# Patient Record
Sex: Female | Born: 1935
Health system: Southern US, Community
[De-identification: ages and names within clinical notes are randomized; demographics above are authoritative.]

## PROBLEM LIST (undated history)

## (undated) DIAGNOSIS — I219 Acute myocardial infarction, unspecified: Secondary | ICD-10-CM

## (undated) DIAGNOSIS — J45909 Unspecified asthma, uncomplicated: Secondary | ICD-10-CM

## (undated) DIAGNOSIS — I10 Essential (primary) hypertension: Secondary | ICD-10-CM

## (undated) HISTORY — DX: Unspecified asthma, uncomplicated: J45.909

## (undated) HISTORY — PX: OTHER SURGICAL HISTORY: SHX169

## (undated) HISTORY — DX: Essential (primary) hypertension: I10

## (undated) HISTORY — PX: TOTAL ABDOMINAL HYSTERECTOMY W/ BILATERAL SALPINGOOPHORECTOMY: SHX83

## (undated) HISTORY — DX: Acute myocardial infarction, unspecified: I21.9

---

## 1999-02-10 ENCOUNTER — Ambulatory Visit (HOSPITAL_BASED_OUTPATIENT_CLINIC_OR_DEPARTMENT_OTHER): Admission: RE | Admit: 1999-02-10 | Discharge: 1999-02-10 | Payer: Self-pay | Admitting: Otolaryngology

## 1999-09-29 ENCOUNTER — Encounter: Admission: RE | Admit: 1999-09-29 | Discharge: 1999-09-29 | Payer: Self-pay | Admitting: Internal Medicine

## 1999-09-29 ENCOUNTER — Encounter: Payer: Self-pay | Admitting: Internal Medicine

## 1999-10-20 ENCOUNTER — Encounter: Payer: Self-pay | Admitting: Internal Medicine

## 1999-10-20 ENCOUNTER — Ambulatory Visit (HOSPITAL_COMMUNITY): Admission: RE | Admit: 1999-10-20 | Discharge: 1999-10-20 | Payer: Self-pay | Admitting: Internal Medicine

## 2003-08-25 ENCOUNTER — Ambulatory Visit (HOSPITAL_COMMUNITY): Admission: RE | Admit: 2003-08-25 | Discharge: 2003-08-25 | Payer: Self-pay | Admitting: Internal Medicine

## 2007-06-20 ENCOUNTER — Inpatient Hospital Stay (HOSPITAL_COMMUNITY): Admission: EM | Admit: 2007-06-20 | Discharge: 2007-06-24 | Payer: Self-pay | Admitting: Emergency Medicine

## 2007-06-20 ENCOUNTER — Ambulatory Visit: Payer: Self-pay | Admitting: Cardiology

## 2007-06-21 ENCOUNTER — Encounter (INDEPENDENT_AMBULATORY_CARE_PROVIDER_SITE_OTHER): Payer: Self-pay | Admitting: Internal Medicine

## 2007-07-24 ENCOUNTER — Encounter: Payer: Self-pay | Admitting: Internal Medicine

## 2007-07-24 ENCOUNTER — Encounter: Admission: RE | Admit: 2007-07-24 | Discharge: 2007-07-24 | Payer: Self-pay | Admitting: Internal Medicine

## 2008-02-10 ENCOUNTER — Encounter: Payer: Self-pay | Admitting: Internal Medicine

## 2008-03-27 ENCOUNTER — Ambulatory Visit: Payer: Self-pay | Admitting: Internal Medicine

## 2008-03-27 DIAGNOSIS — I219 Acute myocardial infarction, unspecified: Secondary | ICD-10-CM | POA: Insufficient documentation

## 2008-03-27 DIAGNOSIS — J45909 Unspecified asthma, uncomplicated: Secondary | ICD-10-CM | POA: Insufficient documentation

## 2008-03-27 DIAGNOSIS — I10 Essential (primary) hypertension: Secondary | ICD-10-CM | POA: Insufficient documentation

## 2008-04-16 ENCOUNTER — Ambulatory Visit: Payer: Self-pay | Admitting: Internal Medicine

## 2008-04-16 ENCOUNTER — Encounter: Payer: Self-pay | Admitting: Internal Medicine

## 2008-04-16 DIAGNOSIS — R0602 Shortness of breath: Secondary | ICD-10-CM | POA: Insufficient documentation

## 2008-04-28 ENCOUNTER — Ambulatory Visit: Payer: Self-pay | Admitting: Internal Medicine

## 2008-07-06 ENCOUNTER — Ambulatory Visit: Payer: Self-pay | Admitting: Internal Medicine

## 2008-07-17 DIAGNOSIS — J301 Allergic rhinitis due to pollen: Secondary | ICD-10-CM | POA: Insufficient documentation

## 2008-11-03 ENCOUNTER — Ambulatory Visit: Payer: Self-pay | Admitting: Internal Medicine

## 2009-03-11 IMAGING — CR DG CHEST 2V
2 series · 2 of 2 positions shown · non-contrast
Comparison: 07/24/2007 and 06/20/2007

CLINICAL DATA: Wheezing.  Asthma.

CHEST - 2 VIEW

[view not recorded (1 of 2)]
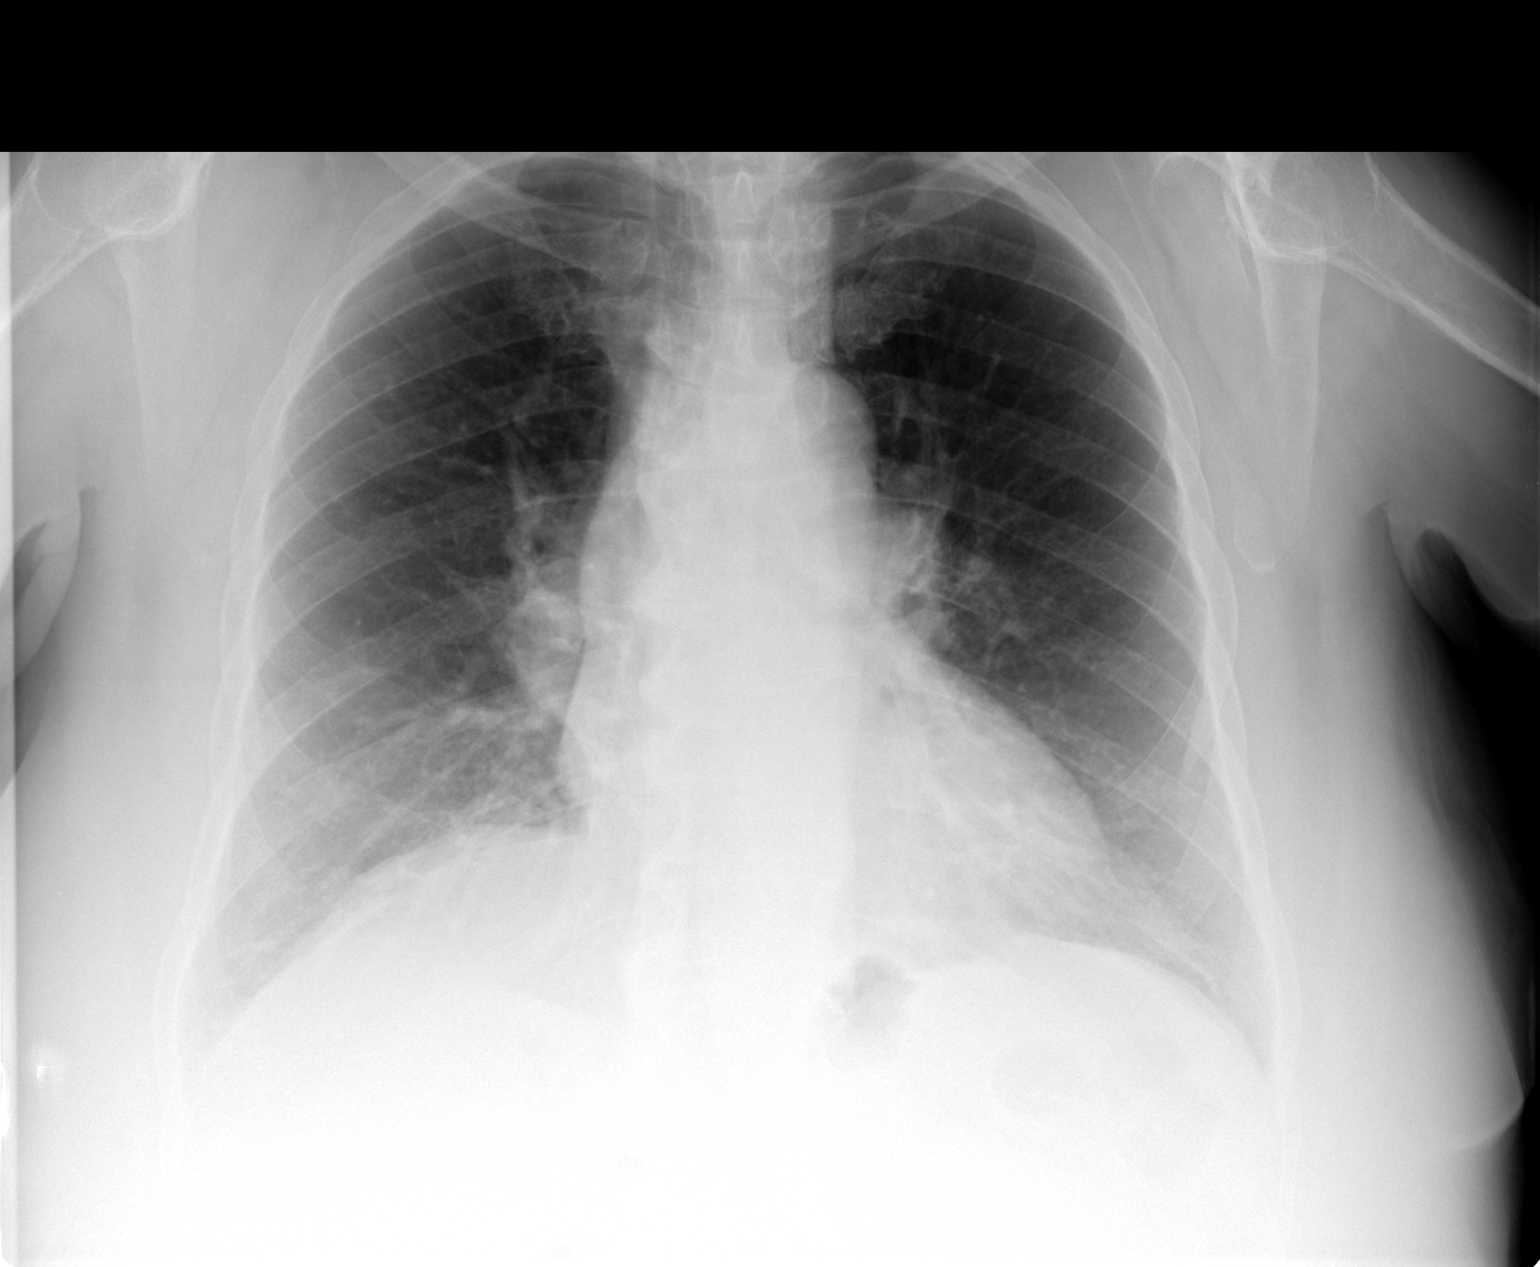

[view not recorded (2 of 2)]
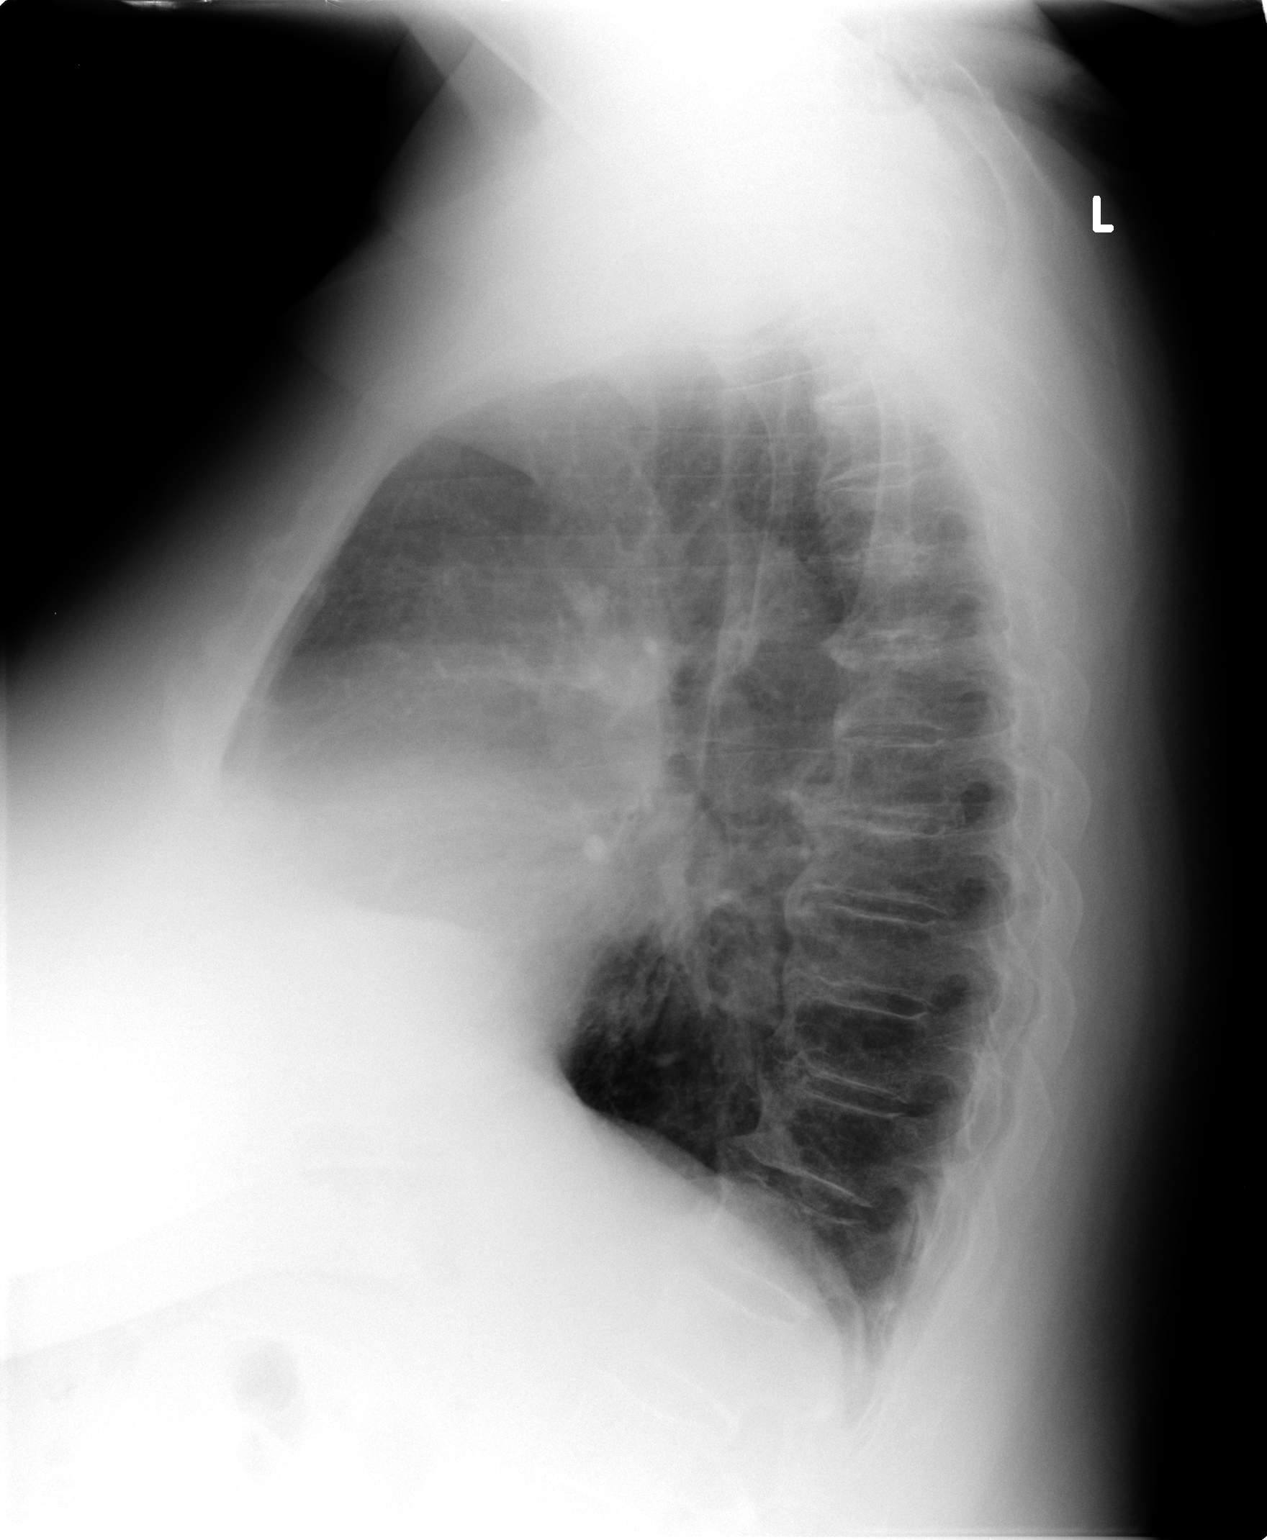

[2 of 2 positions shown; findings below may reference images not displayed]

FINDINGS: Persistent streaky opacity at the cardiac apex is
probably related to scarring.  Right lung is clear. The
cardiopericardial silhouette is within normal limits for size.
There is some fullness in the right hilum but when comparing to the
06/20/07 exam, this is unchanged. Imaged bony structures of the
thorax are intact.
IMPRESSION: Stable exam.  Probable scarring in the left lung base.  No new or
acute interval findings.

## 2009-05-13 ENCOUNTER — Telehealth: Payer: Self-pay | Admitting: Internal Medicine

## 2009-05-19 ENCOUNTER — Ambulatory Visit: Payer: Self-pay | Admitting: Internal Medicine

## 2009-05-20 ENCOUNTER — Telehealth: Payer: Self-pay | Admitting: Internal Medicine

## 2009-07-13 ENCOUNTER — Telehealth (INDEPENDENT_AMBULATORY_CARE_PROVIDER_SITE_OTHER): Payer: Self-pay | Admitting: *Deleted

## 2009-11-10 ENCOUNTER — Ambulatory Visit: Payer: Self-pay | Admitting: Internal Medicine

## 2010-05-09 ENCOUNTER — Ambulatory Visit: Payer: Self-pay | Admitting: Internal Medicine

## 2010-09-11 ENCOUNTER — Encounter: Payer: Self-pay | Admitting: Internal Medicine

## 2010-09-20 NOTE — Assessment & Plan Note (Signed)
Summary: 6 MONTH RETURN/[facility]   Copy to:  Murtis Sink Primary Provider/Referring Provider:  Dorothyann Peng  CC:  6 month follow up.  states breathing is doing fine.  denies wheezing, chest tightness, SOB, and and cough.  .  History of Present Illness: 07/06/08-Running made him cough. Says Nasonex sample stopped his cough. Using Symbicort. Denies wheeze waking him, purulent or bloody discharge, chest pain or palpitation.  11/03/08- Asthma, allergic rhinitis Left ear popped and left shoulder was sore after lifting, so she worries she may have ear infection. Got through winter without bad colds. She uses Symbicort as directed and rarely gets tight or wheezey now. Ears feel ok this morning. Using Nasonex sample now. Denies fever, chilss, headache, swollen glands, rash,  05/19/09- Asthma, allergic rhinitis...................................daughter here Got through Summer and early Fall doing well as long as she uses Symbicort once daily. last week she increased to twice daily just briefly. Has never routinely used Symbicort twice daily. We discussed price. Dr Allyne Gee office sent her somethjing, possibly Dulera, without directions.  November 10, 2009- Asthma, allergic rhinitis...................daughter here Doing fairly well without acute problems. She doesn't go outside any more than she can help.  We discussed nasal steroids. She was given sample of Omnaris and didn't realize equivalence to Nasonex. Discussion of cost effectiveness.   Current Medications (verified): 1)  Symbicort 160-4.5 Mcg/act  Aero (Budesonide-Formoterol Fumarate) .... Two Puffs Twice Daily 2)  Micardis Hct 40-12.5 Mg  Tabs (Telmisartan-Hctz) .... Take One Tablet By Mouth Daily. 3)  Nasonex 50 Mcg/act Susp (Mometasone Furoate) .Marland Kitchen.. 1-2 Sprays Each Nostril Once Daily 4)  Daily Multiple Vitamins  Tabs (Multiple Vitamin) .Marland Kitchen.. 1 Once Daily 5)  Proventil Hfa 108 (90 Base) Mcg/act Aers (Albuterol Sulfate) .... As Directed As  Needed 6)  Vitamin D (Ergocalciferol) 50000 Unit Caps (Ergocalciferol) .... Once Weekly 7)  Vitamin D 400 Unit Caps (Cholecalciferol) .... Once Daily  Allergies (verified): 1)  ! Aspirin 2)  Sulfa  Past History:  Past Medical History: Last updated: 03/27/2008 Hx of MYOCARDIAL INFARCTION (ICD-410.90) HYPERTENSION (ICD-401.9) ASTHMA (ICD-493.90) Pneumonia -2008  Family History: Last updated: 03/27/2008 Father - emphysema Maternal grandmother - heart disease Mother - altheimers  Social History: Last updated: 03/27/2008 Widowed Lives with daughter Patient never smoked. 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    Vital Signs:  Patient profile:   77 year old female Height:      61 inches Weight:      201.13 pounds BMI:     38.14 O2 Sat:      100 % on Room air Pulse rate:   88 / minute BP sitting:   122 / 68  (left arm) Cuff size:   large  Vitals Entered By: Gweneth Dimitri RN (November 10, 2009 11:15 AM)  O2 Flow:  Room air CC: 6 month follow up.  states breathing is doing fine.  denies wheezing, chest tightness, SOB, and cough.   Comments Medications reviewed with patient Daytime contact number verified with patient. Gweneth Dimitri RN  November 10, 2009 11:15 AM    Physical Exam  Additional  Exam:  General: A/Ox3; pleasant and cooperative, NAD, SKIN: no rash, lesions NODES: no lymphadenopathy HEENT: [place]/AT, EOM- WNL, Conjuctivae- clear, PERRLA, TM-WNL, Nose- clearosa looks normal, Throat- clear and wnl, Mallampati  III NECK: Supple w/  fair ROM, JVD- none, normal carotid impulses w/o bruits Thyroid-  CHEST: Clear to P&A, unlabored without cough HEART: RRR, no m/g/r heard ABDOMEN: Soft and nl;  XBJ:YNWG, nl pulses, no edema  NEURO: Grossly intact to observation      Impression & Recommendations:  Problem # 1:  ALLERGIC RHINITIS (ICD-477.9)  After discussion we will give script for fluticasone for cost check. Her updated medication list for this problem includes:    Flonase 50 Mcg/act Susp (Fluticasone propionate) .Marland Kitchen... 1-2 sprays each nostril once daily  Problem # 2:  ASTHMA (ICD-493.90) Good control.  Medications Added to Medication List This Visit: 1)  Flonase 50 Mcg/act Susp (Fluticasone propionate) .Marland Kitchen.. 1-2 sprays each nostril once daily 2)  Vitamin D (ergocalciferol) 50000 Unit Caps (Ergocalciferol) .... Once weekly 3)  Vitamin D 400 Unit Caps (Cholecalciferol) .... Once daily  Other Orders: Est. Patient Level III (95621)  Patient Instructions: 1)  Please schedule a follow-up appointment in 6 months. 2)  Script for flonase/fluticasone steroid generic nasal spray. Try this if it is cheaper, compared with Omnaris or Nasonex. For it to work, you need to use it every day. Prescriptions: FLONASE 50 MCG/ACT SUSP (FLUTICASONE PROPIONATE) 1-2 sprays each nostril once daily  #1 x prn   Entered and Authorized by:   Waymon Budge MD   Signed by:   Waymon Budge MD on 11/10/2009   Method used:   Print then Give to Patient   RxID:   [id number]

## 2010-09-20 NOTE — Assessment & Plan Note (Signed)
Summary: rov 6 months///kp   Copy to:  Murtis Sink Primary Provider/Referring Provider:  Dorothyann Peng  CC:  6 month follow up , pt states symbicort is working well only uses in am, and rarely uses proventil.  History of Present Illness: 05/19/09- Asthma, allergic rhinitis...................................daughter here Got through Summer and early Fall doing well as long as she uses Symbicort once daily. last week she increased to twice daily just briefly. Has never routinely used Symbicort twice daily. We discussed price. Dr Allyne Gee office sent her somethjing, possibly Dulera, without directions.  November 10, 2009- Asthma, allergic rhinitis...................daughter here Doing fairly well without acute problems. She doesn't go outside any more than she can help.  We discussed nasal steroids. She was given sample of Omnaris and didn't realize equivalence to Nasonex. Discussion of cost effectiveness.  May 09, 2010- Asthma, allergic rhjinitis We had done a cost comparison on steroid nasal sprays in the Spring. She made it through the summer ok. She says she hasn't wheezed since Dr Allyne Gee took her off of dairy products. She continues Symbicort, used just once daily, and hasn't need the rescue inhaler in months. She preferred Omnaris sample but it was too expensive compared with fluticasone.  Asthma History    Initial Asthma Severity Rating:    Age range: 12+ years    Symptoms: 0-2 days/week    Nighttime Awakenings: 0-2/month    Interferes w/ normal activity: no limitations    SABA use (not for EIB): 0-2 days/week    Asthma Severity Assessment: Intermittent   Preventive Screening-Counseling & Management  Alcohol-Tobacco     Smoking Status: never     Passive Smoke Exposure: yes     Passive Smoke Counseling: to avoid passive smoke exposure  Comments: Husband and coworkers did smoke in past.  Current Medications (verified): 1)  Symbicort 160-4.5 Mcg/act  Aero  (Budesonide-Formoterol Fumarate) .... Two Puffs Twice Daily 2)  Micardis Hct 40-12.5 Mg  Tabs (Telmisartan-Hctz) .... Take One Tablet By Mouth Daily. 3)  Flonase 50 Mcg/act Susp (Fluticasone Propionate) .Marland Kitchen.. 1-2 Sprays Each Nostril Once Daily 4)  Daily Multiple Vitamins  Tabs (Multiple Vitamin) .Marland Kitchen.. 1 Once Daily 5)  Proventil Hfa 108 (90 Base) Mcg/act Aers (Albuterol Sulfate) .... As Directed As Needed 6)  Vitamin D (Ergocalciferol) 50000 Unit Caps (Ergocalciferol) .... Once Weekly 7)  Vitamin D 400 Unit Caps (Cholecalciferol) .... Once Daily  Allergies (verified): 1)  ! Aspirin 2)  Sulfa  Past History:  Past Medical History: Last updated: 03/27/2008 Hx of MYOCARDIAL INFARCTION (ICD-410.90) HYPERTENSION (ICD-401.9) ASTHMA (ICD-493.90) Pneumonia -2008  Past Surgical History: Last updated: 11/10/2009 sinus surgery- polyps - 2000 T [facility] and [facility]  Family History: Last updated: 03/27/2008 Father - emphysema Maternal grandmother - heart disease Mother - altheimers  Social History: Last updated: 03/27/2008 Widowed Lives with daughter Patient never smoked. 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  The patient denies shortness of breath at rest, productive cough, non-productive cough, coughing up blood, chest pain, irregular heartbeats, acid heartburn, indigestion, loss of appetite, weight change, abdominal pain, difficulty swallowing, sore throat, tooth/dental problems, headaches, nasal congestion/difficulty breathing through nose, and sneezing.    Vital Signs:  Patient profile:   77 year old female Height:      63 inches  Weight:      203.2 pounds BMI:     36.13 O2 Sat:      96 % on Room air Pulse rate:   89 / minute BP sitting:   140 / 78  (left arm)  Vitals Entered By: Renold Genta RCP, LPN (May 09, 2010 11:12 AM)  O2 Sat at Rest %:  96% O2 Flow:  Room air CC: 6 month follow up , pt states symbicort is working well only uses in am, rarely uses proventil Comments Medications reviewed with patient Renold Genta RCP, LPN  May 09, 2010 11:15 AM    Physical Exam  Additional Exam:  General: A/Ox3; pleasant and cooperative, NAD, SKIN: no rash, lesions NODES: no lymphadenopathy HEENT: [place]/AT, EOM- WNL, Conjuctivae- clear, PERRLA, TM-WNL, Nose- clear looks normal, Throat- clear and wnl, Mallampati  III NECK: Supple w/ fair ROM, JVD- none, normal carotid impulses w/o bruits Thyroid-  CHEST: Clear to P&A, unlabored without cough, able to laugh HEART: RRR, no m/g/r heard ABDOMEN: Soft and nl; obese WGN:FAOZ, nl pulses, no edema  NEURO: Grossly intact to observation      Impression & Recommendations:  Problem # 1:  ASTHMA (ICD-493.90) She is dong very well with good control on Symbicort. I don't know how much effect avoiding  dairy products may have. We will make sure meds are available for the winter.  Flu vax discussed. She wants her daughter with her when she gets the flu shot.  Problem # 2:  ALLERGIC RHINITIS (ICD-477.9) Nasal steroid has been usually sufficient.  Her updated medication list for this problem includes:    Flonase 50 Mcg/act Susp (Fluticasone propionate) .Marland Kitchen... 1-2 sprays each nostril once daily  Other Orders: Est. Patient Level III (30865)  Patient Instructions: 1)  Please schedule a follow-up appointment in 6 months. 2)  Scripts refilling the Proventil rescue inhaler and the Symbicort maintenance inhaler were sent to your drug store.  3)  Don't forget to get a flu shot this Fall. Prescriptions: PROVENTIL HFA 108 (90 BASE) MCG/ACT AERS (ALBUTEROL SULFATE) as directed as needed  #1 x prn   Entered and Authorized by:   Waymon Budge MD   Signed by:   Waymon Budge MD on 05/09/2010   Method used:   Electronically to         [facility] [phone]* (retail)       [address], [place]  [address]       Ph: [phone] or [phone]       Fax: [phone]   RxID:   [id number] SYMBICORT 160-4.5 MCG/ACT  AERO (BUDESONIDE-FORMOTEROL FUMARATE) Two puffs twice daily  #1 x prn   Entered and Authorized by:   Waymon Budge MD   Signed by:   Waymon Budge MD on 05/09/2010   Method used:   [facility]ron[facility] to        [facility] [phone]* (retail)       [address]       [place], [place]  [address]       Ph: [phone] or [phone]       Fax: [phone]   RxID:   [id number]

## 2010-11-07 ENCOUNTER — Ambulatory Visit: Payer: Self-pay | Admitting: Internal Medicine

## 2010-12-07 ENCOUNTER — Encounter: Payer: Self-pay | Admitting: Internal Medicine

## 2010-12-07 ENCOUNTER — Telehealth: Payer: Self-pay | Admitting: Internal Medicine

## 2010-12-07 NOTE — Telephone Encounter (Signed)
Spoke w/ pt and advised her 1 sample of symbicort 160 was left upfront for pick up. Pt verbalized understanding and nothing further was needed

## 2010-12-09 ENCOUNTER — Ambulatory Visit: Payer: Self-pay | Admitting: Internal Medicine

## 2011-01-03 NOTE — Discharge Summary (Signed)
Christina Terry, Christina Terry               ACCOUNT NO.:  [id number]   MEDICAL RECORD NO.:  [id number]          PATIENT TYPE:  INP   LOCATION:  5707                         FACILITY:  [facility]   PHYSICIAN:  Altha Harm, MDDATE OF BIRTH:  1932/02/02   DATE OF ADMISSION:  07-20-07  DATE OF DISCHARGE:  06/24/2007                               DISCHARGE SUMMARY   DISCHARGE DISPOSITION:  Home.   FINAL DISCHARGE DIAGNOSES:  1. Left lower lobe pneumonia with sepsis.  2. Hypertension, resolved.  3. Pulmonary edema, resolved.  4. History of hypertension.   DISCHARGE MEDICATIONS:  1. Benicar/hydrochlorothiazide 20/12.5 mg p.o. daily.  2. Ciprofloxacin 500 mg p.o. b.i.d. x2 days.   CONSULTATIONS:  None.   PROCEDURE:  None.   DIAGNOSTIC STUDIES:  1. Chest x-ray done on 07/20/2007 which shows left lower lobe      pneumonia.  2. A 2D echocardiogram which shows normal left ventricular systolic      function with an ejection fraction of 70%, mild increase in left      ventricular wall thickness, and no regional wall motion      abnormalities.   CHIEF COMPLAINT:  Altered mental status.   HISTORY OF PRESENT ILLNESS:  Please see the H and P dictated by Dr.  Ashley Royalty for details of the HPI.   HOSPITAL COURSE:  Left lower lobe pneumonia.  The patient was admitted  and found to have a left lower lobe pneumonia.  She was also dehydrated  secondary to several days of poor p.o. intake.  The patient was given  aggressive IV hydration.  She appeared to have some degree of sepsis  with her pneumonia manifested by low blood pressures.  The patient's  blood pressures remained marginal for approximately 48 hours, and after  2 days of aggressive hydration, her blood pressures became normotensive,  and the patient did have some pulmonary edema associated with it.  The  IV fluids were stopped, Lasix was given to diurese the patient, and the  patient's blood pressures remained normal.  The patient  was started on  IV antibiotics on her day of admission to include Rocephin and  azithromycin.  She was continued on them until her blood pressure  stabilized and then switched over to oral antibiotics.  Currently, the  patient's sepsis and hypoxia have both resolved.  Her pulmonary edema is  also resolved.  The patient does continue to have a pneumonia which is  resolving.  However, is manageable by p.o. antibiotics.  The patient is  being sent home on ciprofloxacin 500 mg p.o. b.i.d. for an additional 2  1/2 days.   She is to follow up with her primary care physician, Dr. Lelon Perla, in 5  days or p.r.n. whichever comes first.   DIETARY RESTRICTIONS:  The patient should be on a heart healthy low  sodium diet.   PHYSICAL RESTRICTIONS:  None.      Altha Harm, MD  Electronically Signed     MAM/MEDQ  D:  06/24/2007  T:  06/24/2007  Job:  [id number]

## 2011-01-03 NOTE — H&P (Signed)
Christina Terry, Christina Terry               ACCOUNT NO.:  [id number]   MEDICAL RECORD NO.:  [id number]          PATIENT TYPE:  INP   LOCATION:  5707                         FACILITY:  [facility]   PHYSICIAN:  Altha Harm, MDDATE OF BIRTH:  08-May-1932   DATE OF ADMISSION:  06/20/2007  DATE OF DISCHARGE:                              HISTORY & PHYSICAL   CHIEF COMPLAINT:  General malaise.   HISTORY OF PRESENT ILLNESS:  This is a 77 year old lady who for the past  3 days states that she has not been feeling well.  The patient describes  her condition as generally feeling weak and wanting to sleep  excessively.  She denies any cough, although her daughter states that  she has heard her coughing during the night, but clearly the cough is  nonproductive.  She denies any diarrhea, she denies any chest pain, she  denies any rhinorrhea.  The patient denies any fevers, although she may  have had a tactile fever at home.   She denies any diarrhea, nausea or vomiting, she denies any chills.  She  denies any sick contacts.   PAST MEDICAL HISTORY:  Significant for:  1. Hypertension.  2. Questionable MI in the past.   FAMILY HISTORY:  Significant for hypertension.   SOCIAL HISTORY:  The patient resides with her daughter.  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   ALLERGIES:  NO KNOWN DRUG ALLERGIES.   CURRENT MEDICATIONS:  Benicar/hydrochlorothiazide 20/12.5 mg p.o. daily.   PRIMARY CARE PHYSICIAN:  Dr. Dorothyann Peng.   REVIEW OF SYSTEMS:  A comprehensive review of systems was performed to  include at least 12 systems.  All systems are negative except as noted  in the HPI.   Studies in the emergency room show the following:  A chest x-ray shows  pneumonia in the right lower lobe.  Hemogram shows a white blood cell  count of 29.2, hemoglobin of 12.2, platelet count of 254.  Sodium is  130, potassium 3.5, chloride 97, bicarb 24, BUN 23,  creatinine 1.9.   PHYSICAL EXAMINATION:  GENERAL:  The patient is laying in bed.  The  patient appears sick but not toxic appearing.  VITAL SIGNS:  Temperature upon arrival to the emergency room was 101.4.  Patient received Tylenol, temperature currently 100.8.  Blood pressure  of 112/53, heart rate 104, respiratory rate 26, O2 saturations 94% on  room air.  HEENT EXAMINATION:  She is normocephalic, atraumatic.  Pupils are  equally round and reactive to light.  Tympanic membranes are translucent  bilaterally.  Oropharynx:  Mucosa is dry, and there is no exudate,  erythema, or lesions noted.  NECK EXAMINATION:  Trachea is midline.  No masses, no thyromegaly, no  JVD, no carotid bruit.  RESPIRATORY EXAMINATION:  Patient is tachypneic; however, she has no  accessory muscle use.  She is to clear to auscultation except in the  right lower lobe.  She does have some E to A changes also noted there.  However, there is no increased vocal fremitus  noted.  CARDIOVASCULAR:  She has got mild tachycardia, normal S1 and S2.  No  murmurs, rubs, or gallops are noted.  PMI is nondisplaced.  No heaves or  thrills on palpation.  ABDOMINAL EXAMINATION:  Abdomen is obese, soft, nontender, nondistended.  No masses, no hepatosplenomegaly noted.  LYMPH NODE SURVEY:  She has got no cervical, axillary, inguinal  lymphadenopathy noted.  MUSCULOSKELETAL:  Patient has no warmth, swelling, or erythema around  the joints.  She has got no spinal tenderness.  Gait is deferred at this  time.  NEUROLOGICAL EXAMINATION:  Patient has no focal neurological deficits,  and Cranial nerves II through XII are grossly intact.  DTRs are 2+  bilaterally upper and lower extremities.  Sensory is intact to light  touch and proprioception.  PSYCHIATRIC:  She is alert and oriented x3, good insight and cognition,  and good recent and remote recall.   ASSESSMENT:  This is a patient who presents with:  1. A right lower lobe  pneumonia.  2. Leukocytosis.  3. Fever.  4. Hyponatremia.  5. Hypokalemia.  6. Mild renal insufficiency.  7. Anemia.  8. A history of hypertension.   PLAN:  The patient is going to be admitted and started on azithromycin  and ceftriaxone.  She will also receive aggressive rehydration as she is  also somewhat dehydrated.  The patient will be supported with oxygen as  needed, and further therapy will be determined based upon patient's  initial response to treatment.      Altha Harm, MD  Electronically Signed     MAM/MEDQ  D:  06/20/2007  T:  06/21/2007  Job:  [id number]   cc:   Candyce Churn. Allyne Gee, M.D.

## 2011-01-09 ENCOUNTER — Encounter: Payer: Self-pay | Admitting: Internal Medicine

## 2011-01-13 ENCOUNTER — Ambulatory Visit: Payer: Self-pay | Admitting: Internal Medicine

## 2011-01-31 ENCOUNTER — Encounter: Payer: Self-pay | Admitting: Internal Medicine

## 2011-01-31 ENCOUNTER — Ambulatory Visit (INDEPENDENT_AMBULATORY_CARE_PROVIDER_SITE_OTHER): Payer: Medicare Other | Admitting: Internal Medicine

## 2011-01-31 VITALS — BP 138/72 | HR 79 | Ht 63.0 in | Wt 196.6 lb

## 2011-01-31 DIAGNOSIS — J45909 Unspecified asthma, uncomplicated: Secondary | ICD-10-CM

## 2011-01-31 DIAGNOSIS — J309 Allergic rhinitis, unspecified: Secondary | ICD-10-CM

## 2011-01-31 MED ORDER — ALBUTEROL SULFATE HFA 108 (90 BASE) MCG/ACT IN AERS: 2.0000 | INHALATION_SPRAY | Freq: Four times a day (QID) | RESPIRATORY_TRACT | Status: DC | PRN

## 2011-01-31 NOTE — Progress Notes (Signed)
  Subjective:    Patient ID: Christina Terry, female    DOB: 06-07-1932, 77 y.o.   MRN: [id number]  HPI 01/31/11- 79 yoF followed for asthma, allergic rhinitis, complicated by hx MI, HBP. Daughter here Last here May 09, 2010 - note reviewed.  She has had just a little wheeze very occasionally with rare need for her nebulizer. She has not had any overt asthma attack and sees no reason to spend money on a rescue inhaler. Denies any new problems, heart or etc, since last here.   Review of Systems Constitutional:   No weight loss, night sweats,  Fevers, chills, fatigue, lassitude. HEENT:   No headaches,  Difficulty swallowing,  Tooth/dental problems,  Sore throat,                No sneezing, itching, ear ache, nasal congestion, post nasal drip,   CV:  No chest pain,  Orthopnea, PND, swelling in lower extremities, anasarca, dizziness, palpitations  GI  No heartburn, indigestion, abdominal pain, nausea, vomiting, diarrhea, change in bowel habits, loss of appetite  Resp: No shortness of breath with exertion or at rest.  No excess mucus, no productive cough,  No non-productive cough,  No coughing up of blood.  No change in color of mucus. Rare wheezing.    Skin: no rash or lesions.   GU: no dysuria, change in color of urine, no urgency or frequency.  No flank pain.  MS:  No joint pain or swelling.  No decreased range of motion.  No back pain.  Psych:  No change in mood or affect. No depression or anxiety.  No memory loss.       Objective:   Physical Exam General- Alert, Oriented, Affect-appropriate, Distress- none acute  overweight  Skin- rash-none, lesions- none, excoriation- none  Lymphadenopathy- none  Head- atraumatic  Eyes- Gross vision intact, PERRLA, conjunctivae clear secretions  Ears- Hearing, canals, Tm - normal  Nose- Clear, No-Septal dev, mucus, polyps, erosion, perforation   Throat- Mallampati II , mucosa clear , drainage- none, tonsils- atrophic  Neck- flexible  , trachea midline, no stridor , thyroid nl, carotid no bruit  Chest - symmetrical excursion , unlabored     Heart/CV- RRR , no murmur , no gallop  , no rub, nl s1 s2                     - JVD- none , edema- none, stasis changes- none, varices- none     Lung- clear to P&A- shallow c/w body habitus, wheeze- none, cough- none , dullness-none, rub- none     Chest wall-   Abd- tender-no, distended-no, bowel sounds-present, HSM- no  Br/ Gen/ Rectal- Not done, not indicated  Extrem- cyanosis- none, clubbing, none, atrophy- none, strength- nl  Neuro- grossly intact to observation         Assessment & Plan:

## 2011-01-31 NOTE — Assessment & Plan Note (Signed)
Relatively mild spring pollen season for her this year. Did notice eyes watering if close to her trees, but no intervention needed.

## 2011-01-31 NOTE — Assessment & Plan Note (Signed)
Good control generally. I would like her to have a rescue inhaler available and explained that.

## 2011-05-30 LAB — DIFFERENTIAL
Basophils Absolute: 0
Basophils Relative: 0
Basophils Relative: 1
Eosinophils Absolute: 0.1
Eosinophils Relative: 1
Monocytes Absolute: 1 — ABNORMAL HIGH
Monocytes Absolute: 1.2 — ABNORMAL HIGH
Monocytes Relative: 9
Neutro Abs: 6.4
Neutrophils Relative %: 71

## 2011-05-30 LAB — CBC
Hemoglobin: 10.6 — ABNORMAL LOW
Hemoglobin: 9.6 — ABNORMAL LOW
Hemoglobin: 9.6 — ABNORMAL LOW
MCHC: 34.1
MCHC: 34.6
MCV: 93.7
Platelets: 300
RBC: 2.95 — ABNORMAL LOW
RDW: 13
RDW: 13.1

## 2011-05-30 LAB — BASIC METABOLIC PANEL
BUN: 10
BUN: 17
CO2: 22
Calcium: 8.3 — ABNORMAL LOW
Calcium: 9.2
Chloride: 109
Creatinine, Ser: 0.93
GFR calc non Af Amer: 55 — ABNORMAL LOW
Glucose, Bld: 119 — ABNORMAL HIGH
Glucose, Bld: 92
Potassium: 4.3
Sodium: 137
Sodium: 139

## 2011-05-31 LAB — CBC
MCHC: 35.1
MCV: 93.5
Platelets: 254
RBC: 3.07 — ABNORMAL LOW
RBC: 3.78 — ABNORMAL LOW
RDW: 12.9
WBC: 20 — ABNORMAL HIGH

## 2011-05-31 LAB — DIFFERENTIAL
Basophils Relative: 0
Eosinophils Absolute: 0
Eosinophils Relative: 0
Lymphocytes Relative: 10 — ABNORMAL LOW
Lymphs Abs: 1.2
Lymphs Abs: 1.9
Monocytes Absolute: 1.8 — ABNORMAL HIGH
Monocytes Relative: 7
Neutrophils Relative %: 83 — ABNORMAL HIGH

## 2011-05-31 LAB — URINE CULTURE
Colony Count: NO GROWTH
Culture: NO GROWTH

## 2011-05-31 LAB — COMPREHENSIVE METABOLIC PANEL
ALT: 17
AST: 46 — ABNORMAL HIGH
Calcium: 8.8
GFR calc Af Amer: 41 — ABNORMAL LOW
Sodium: 130 — ABNORMAL LOW
Total Protein: 7.9

## 2011-05-31 LAB — CULTURE, BLOOD (ROUTINE X 2)
Culture: NO GROWTH
Culture: NO GROWTH

## 2011-05-31 LAB — URINALYSIS, ROUTINE W REFLEX MICROSCOPIC
Bilirubin Urine: NEGATIVE
Ketones, ur: NEGATIVE
Leukocytes, UA: NEGATIVE
Nitrite: NEGATIVE
Protein, ur: NEGATIVE
Urobilinogen, UA: 0.2
pH: 5.5

## 2011-05-31 LAB — BASIC METABOLIC PANEL
Chloride: 103
Creatinine, Ser: 1.58 — ABNORMAL HIGH
GFR calc Af Amer: 39 — ABNORMAL LOW
GFR calc non Af Amer: 32 — ABNORMAL LOW
Potassium: 3.1 — ABNORMAL LOW

## 2011-06-12 ENCOUNTER — Other Ambulatory Visit: Payer: Self-pay | Admitting: *Deleted

## 2011-06-12 MED ORDER — BUDESONIDE-FORMOTEROL FUMARATE 160-4.5 MCG/ACT IN AERO: 2.0000 | INHALATION_SPRAY | Freq: Two times a day (BID) | RESPIRATORY_TRACT | Status: DC

## 2011-07-31 ENCOUNTER — Telehealth: Payer: Self-pay | Admitting: Pulmonary Disease

## 2011-07-31 NOTE — Telephone Encounter (Signed)
LMTCB

## 2011-08-01 NOTE — Telephone Encounter (Signed)
Pt's daughter aware that pt should OV with [facility] on 12/12 and they can give her a flu shot at that time if she is not running a fever.

## 2011-08-02 ENCOUNTER — Encounter: Payer: Self-pay | Admitting: Internal Medicine

## 2011-08-02 ENCOUNTER — Ambulatory Visit (INDEPENDENT_AMBULATORY_CARE_PROVIDER_SITE_OTHER): Payer: Medicare Other | Admitting: Internal Medicine

## 2011-08-02 DIAGNOSIS — Z23 Encounter for immunization: Secondary | ICD-10-CM

## 2011-08-02 DIAGNOSIS — J019 Acute sinusitis, unspecified: Secondary | ICD-10-CM

## 2011-08-02 DIAGNOSIS — J301 Allergic rhinitis due to pollen: Secondary | ICD-10-CM

## 2011-08-02 DIAGNOSIS — J45909 Unspecified asthma, uncomplicated: Secondary | ICD-10-CM

## 2011-08-02 MED ORDER — PHENYLEPHRINE HCL 1 % NA SOLN
3.0000 [drp] | Freq: Once | NASAL | Status: AC
  Administered 2011-08-02: 3 [drp] via NASAL

## 2011-08-02 MED ORDER — ALBUTEROL SULFATE HFA 108 (90 BASE) MCG/ACT IN AERS: 2.0000 | INHALATION_SPRAY | Freq: Four times a day (QID) | RESPIRATORY_TRACT | Status: DC | PRN

## 2011-08-02 MED ORDER — FLUTICASONE PROPIONATE 50 MCG/ACT NA SUSP: 2.0000 | Freq: Every day | NASAL | Status: DC

## 2011-08-02 MED ORDER — BUDESONIDE-FORMOTEROL FUMARATE 160-4.5 MCG/ACT IN AERO: 2.0000 | INHALATION_SPRAY | Freq: Two times a day (BID) | RESPIRATORY_TRACT | Status: DC

## 2011-08-02 MED ORDER — METHYLPREDNISOLONE ACETATE 80 MG/ML IJ SUSP
80.0000 mg | Freq: Once | INTRAMUSCULAR | Status: AC
  Administered 2011-08-02: 80 mg via INTRAMUSCULAR

## 2011-08-02 NOTE — Progress Notes (Signed)
Patient ID: Christina Terry, female    DOB: July 06, 1932, 77 y.o.   MRN: [id number]  HPI 01/31/11- 79 yoF followed for asthma, allergic rhinitis, complicated by hx MI, HBP. Daughter here Last here May 09, 2010 - note reviewed.  She has had just a little wheeze very occasionally with rare need for her nebulizer. She has not had any overt asthma attack and sees no reason to spend money on a rescue inhaler. Denies any new problems, heart or etc, since last here.   08/02/11-  77 yoF followed for asthma, allergic rhinitis, complicated by hx MI, HBP. Daughter here Visiting [place] recently, had sinusitis with green nasal discharge but no headache, fever. Per Dr. Jovita Gamma Z-Pak. She denies fever, sore throat, muscle ache or postnasal drainage. Remains hoarse chest feels comfortable. Has run out of Symbicort, not using rescue inhaler but did use her nebulizer this morning.  Review of Systems-see HPI Constitutional:   No-   weight loss, night sweats, fevers, chills, fatigue, lassitude. HEENT:   No-  headaches, difficulty swallowing, tooth/dental problems, sore throat,       No-  sneezing, itching, ear ache, + nasal congestion, post nasal drip,  CV:  No-   chest pain, orthopnea, PND, swelling in lower extremities, anasarca,                                  dizziness, palpitations Resp: No- acute shortness of breath with exertion or at rest.              No-   productive cough,  No non-productive cough,  No- coughing up of blood.              No-   change in color of mucus.  +Mild wheezing.   Skin: No-   rash or lesions. GI:  No-   heartburn, indigestion, abdominal pain, nausea, vomiting, diarrhea,                 change in bowel habits, loss of appetite GU: No-   dysuria, change in color of urine, no urgency or frequency.  No- flank pain. MS:  No-   joint pain or swelling.  No- decreased range of motion.  No- back pain. Neuro-     nothing unusual Psych:  No- change in mood or affect. No  depression or anxiety.  No memory loss.      Objective:   Physical Exam General- Alert, Oriented, Affect-appropriate, Distress- none acute, overweight Skin- rash-none, lesions- none, excoriation- none Lymphadenopathy- none Head- atraumatic            Eyes- Gross vision intact, PERRLA, conjunctivae clear secretions            Ears- Hearing, canals-normal            Nose- Clear mucus bridging, no-Septal dev, mucus, polyps, erosion, perforation             Throat- Mallampati II , mucosa red , drainage- none, tonsils- atrophic.  Dentures. Hoarse. Neck- flexible , trachea midline, no stridor , thyroid nl, carotid no bruit Chest - symmetrical excursion , unlabored           Heart/CV- RRR , no murmur , no gallop  , no rub, nl s1 s2                           -  JVD- none , edema- none, stasis changes- none, varices- none           Lung- clear to P&A, diminished, unlabored, wheeze- none, cough- none , dullness-none, rub- none           Chest wall-  Abd- tender-no, distended-no, bowel sounds-present, HSM- no Br/ Gen/ Rectal- Not done, not indicated Extrem- cyanosis- none, clubbing, none, atrophy- none, strength- nl Neuro- grossly intact to observation

## 2011-08-02 NOTE — Patient Instructions (Signed)
Scripts refilled  Flu vax  Neb neo nasal  Depo 80

## 2011-08-06 DIAGNOSIS — J019 Acute sinusitis, unspecified: Secondary | ICD-10-CM | POA: Insufficient documentation

## 2011-08-06 NOTE — Assessment & Plan Note (Addendum)
Partially resolved acute bacterial sinusitis with residual sinus congestion and hoarseness. If we can improve drainage it should clear. Plan nasal decongestant inhalation, Depo-Medrol.

## 2011-08-06 NOTE — Assessment & Plan Note (Signed)
Plan-educated on use of rescue versus maintenance medications. Refilled Symbicort and rescue inhaler. Flu vaccine.

## 2011-08-06 NOTE — Assessment & Plan Note (Signed)
Plan-refill Nasonex. 

## 2011-10-07 ENCOUNTER — Encounter (HOSPITAL_COMMUNITY): Payer: Self-pay | Admitting: Emergency Medicine

## 2011-10-07 ENCOUNTER — Emergency Department (HOSPITAL_COMMUNITY)
Admission: EM | Admit: 2011-10-07 | Discharge: 2011-10-07 | Disposition: A | Discharge status: Home / Self Care | Payer: Medicare Other | Source: Home / Self Care | Attending: Emergency Medicine | Admitting: Emergency Medicine

## 2011-10-07 DIAGNOSIS — T50Z95A Adverse effect of other vaccines and biological substances, initial encounter: Secondary | ICD-10-CM

## 2011-10-07 DIAGNOSIS — T8062XA Other serum reaction due to vaccination, initial encounter: Secondary | ICD-10-CM

## 2011-10-07 MED ORDER — TRIAMCINOLONE ACETONIDE 0.1 % EX CREA: TOPICAL_CREAM | Freq: Two times a day (BID) | CUTANEOUS | Status: AC

## 2011-10-07 MED ORDER — TRIAMCINOLONE ACETONIDE 0.1 % EX CREA: TOPICAL_CREAM | Freq: Two times a day (BID) | CUTANEOUS | Status: DC

## 2011-10-07 MED ORDER — PREDNISONE 20 MG PO TABS: 40.0000 mg | ORAL_TABLET | Freq: Every day | ORAL | Status: AC

## 2011-10-07 NOTE — ED Provider Notes (Signed)
History     CSN: [id number]  Arrival date & time 10/07/11  1139   First MD Initiated Contact with Patient 10/07/11 1245      Chief Complaint  Patient presents with  . Arm Pain    (Consider location/radiation/quality/duration/timing/severity/associated sxs/prior treatment) HPI Comments: Patient presents urgent care with a left upper arm pain and redness. Patient reports about a week ago she received a tetanus shot since she was overdue for it. Have been sore since the shot was given but has increased in intensity and now has a area of redness around the injection site then it's looking larger. It's tender at touch and with arm movement. No fevers and  besides recent injection denies any further traumas or injuries. Otherwise patient feels fine no chills or fevers or any other symptoms.   Patient is a 78 y.o. female presenting with arm pain. The history is provided by the patient.  Arm Pain This is a new problem. The current episode started more than 1 week ago. The problem occurs constantly. The problem has not changed since onset.The symptoms are relieved by ice. She has tried acetaminophen for the symptoms.    Past Medical History  Diagnosis Date  . Acute myocardial infarction, unspecified site, episode of care unspecified   . Unspecified asthma   . Unspecified essential hypertension     Past Surgical History  Procedure Date  . Total abdominal hysterectomy w/ bilateral salpingoophorectomy   . Sinus surgey     Family History  Problem Relation Age of Onset  . Emphysema Father   . Heart disease Maternal Grandmother     History  Substance Use Topics  . Smoking status: Never Smoker   . Smokeless tobacco: Not on file  . Alcohol Use: No    OB History    Grav Para Term Preterm Abortions TAB SAB Ect Mult Living                  Review of Systems  Constitutional: Negative for fever.  Skin: Positive for rash. Negative for wound.    Allergies  Aspirin and Sulfonamide  derivatives  Home Medications   Current Outpatient Rx  Name Route Sig Dispense Refill  . ALBUTEROL SULFATE HFA 108 (90 BASE) MCG/ACT IN AERS Inhalation Inhale 2 puffs into the lungs every 6 (six) hours as needed for wheezing or shortness of breath (Rescue inahaler). 1 Inhaler prn  . BUDESONIDE-FORMOTEROL FUMARATE 160-4.5 MCG/ACT IN AERO Inhalation Inhale 2 puffs into the lungs 2 (two) times daily. Rinse mouth 1 Inhaler prn  . ERGOCALCIFEROL 50000 UNITS PO CAPS Oral Take 50,000 Units by mouth once a week.      Marland Kitchen FLUTICASONE PROPIONATE 50 MCG/ACT NA SUSP Nasal Place 2 sprays into the nose daily. 1 g prn  . MULTIVITAMINS PO CAPS Oral Take 1 capsule by mouth daily.      Marland Kitchen PREDNISONE 20 MG PO TABS Oral Take 2 tablets (40 mg total) by mouth daily. 15 tablet 0  . TELMISARTAN-HCTZ 40-12.5 MG PO TABS Oral Take 1 tablet by mouth daily.      . TRIAMCINOLONE ACETONIDE 0.1 % EX CREA Topical Apply topically 2 (two) times daily. X 7 days 30 g 0    BP 133/74  Pulse 80  Temp(Src) 97.6 F (36.4 C) (Oral)  Resp 18  SpO2 95%  Physical Exam  Constitutional: She appears well-developed and well-nourished. No distress.  HENT:  Head: Normocephalic.  Eyes: Conjunctivae are normal.  Neck: Neck supple.  Musculoskeletal:  She exhibits tenderness.       Left shoulder: She exhibits tenderness, swelling and pain. She exhibits normal range of motion and no bony tenderness.       Arms: Skin: Rash noted. There is erythema.    ED Course  Procedures (including critical care time)  Labs Reviewed - No data to display No results found.   1. Vaccine reaction       MDM  Localized reaction at injection site. Erythema is noted in the lateral aspect of deltoid muscle erythema with areas of clear skin motor and exam. Exam is more consistent with a local allergenic reaction reaction than two  a localized cellulitic or infectious process.        Jimmie Molly, MD 10/07/11 1730

## 2011-10-07 NOTE — Discharge Instructions (Signed)
Your exam was consistent with a localized reaction which is a common side effect of this particular vaccine. Have suggested you monitor her for any changes closely looking for signs of infection as discussed during your exam. In the meantime would suggest you take this medicine and also apply this prescribed a cream for 5 days.

## 2011-10-07 NOTE — ED Notes (Signed)
Left deltoid with bruise and redness, patient reports receiving tetanus shot one week ago at this site.  Reports this arm very tender to touch.

## 2012-01-31 ENCOUNTER — Ambulatory Visit: Payer: Medicare Other | Admitting: Internal Medicine

## 2012-05-29 ENCOUNTER — Other Ambulatory Visit: Payer: Self-pay | Admitting: Internal Medicine

## 2012-05-29 DIAGNOSIS — Z139 Encounter for screening, unspecified: Secondary | ICD-10-CM

## 2012-06-24 ENCOUNTER — Other Ambulatory Visit: Payer: Medicare Other

## 2015-08-01 ENCOUNTER — Encounter (HOSPITAL_COMMUNITY): Payer: Self-pay

## 2015-08-01 ENCOUNTER — Emergency Department (INDEPENDENT_AMBULATORY_CARE_PROVIDER_SITE_OTHER)
Admission: EM | Admit: 2015-08-01 | Discharge: 2015-08-01 | Disposition: A | Discharge status: Home / Self Care | Payer: Commercial Managed Care - HMO | Source: Home / Self Care | Attending: Family Medicine | Admitting: Family Medicine

## 2015-08-01 DIAGNOSIS — R05 Cough: Secondary | ICD-10-CM

## 2015-08-01 DIAGNOSIS — R059 Cough, unspecified: Secondary | ICD-10-CM

## 2015-08-01 DIAGNOSIS — J45901 Unspecified asthma with (acute) exacerbation: Secondary | ICD-10-CM | POA: Diagnosis not present

## 2015-08-01 MED ORDER — PREDNISONE 20 MG PO TABS
20.0000 mg | ORAL_TABLET | Freq: Every day | ORAL | Status: DC
Start: 2015-08-01 — End: 2019-02-20

## 2015-08-01 MED ORDER — IPRATROPIUM-ALBUTEROL 0.5-2.5 (3) MG/3ML IN SOLN
3.0000 mL | Freq: Once | RESPIRATORY_TRACT | Status: AC
  Administered 2015-08-01: 3 mL via RESPIRATORY_TRACT

## 2015-08-01 MED ORDER — IPRATROPIUM-ALBUTEROL 0.5-2.5 (3) MG/3ML IN SOLN
RESPIRATORY_TRACT | Status: AC
  Filled 2015-08-01: qty 3

## 2015-08-01 MED ORDER — BENZONATATE 100 MG PO CAPS: 100.0000 mg | ORAL_CAPSULE | Freq: Three times a day (TID) | ORAL | Status: DC

## 2015-08-01 NOTE — Discharge Instructions (Signed)
It was nice seeing you. It seems you have slight exacerbation of your asthma secondary to upper respiratory infection. You may continue the antibiotic given previously. Also use prednisone for 10 days and tessalon as needed for cough. If no improvement please return to see us soon.  Asthma, Adult Asthma is a recurring condition in which the airways tighten and narrow. Asthma can make it difficult to breathe. It can cause coughing, wheezing, and shortness of breath. Asthma episodes, also called asthma attacks, range from minor to life-threatening. Asthma cannot be cured, but medicines and lifestyle changes can help control it. CAUSES Asthma is believed to be caused by inherited (genetic) and environmental factors, but its exact cause is unknown. Asthma may be triggered by allergens, lung infections, or irritants in the air. Asthma triggers are different for each person. Common triggers include:   Animal dander.  Dust mites.  Cockroaches.  Pollen from trees or grass.  Mold.  Smoke.  Air pollutants such as dust, household cleaners, hair sprays, aerosol sprays, paint fumes, strong chemicals, or strong odors.  Cold air, weather changes, and winds (which increase molds and pollens in the air).  Strong emotional expressions such as crying or laughing hard.  Stress.  Certain medicines (such as aspirin) or types of drugs (such as beta-blockers).  Sulfites in foods and drinks. Foods and drinks that may contain sulfites include dried fruit, potato chips, and sparkling grape juice.  Infections or inflammatory conditions such as the flu, a cold, or an inflammation of the nasal membranes (rhinitis).  Gastroesophageal reflux disease (GERD).  Exercise or strenuous activity. SYMPTOMS Symptoms may occur immediately after asthma is triggered or many hours later. Symptoms include:  Wheezing.  Excessive nighttime or early morning coughing.  Frequent or severe coughing with a common  cold.  Chest tightness.  Shortness of breath. DIAGNOSIS  The diagnosis of asthma is made by a review of your medical history and a physical exam. Tests may also be performed. These may include:  Lung function studies. These tests show how much air you breathe in and out.  Allergy tests.  Imaging tests such as X-rays. TREATMENT  Asthma cannot be cured, but it can usually be controlled. Treatment involves identifying and avoiding your asthma triggers. It also involves medicines. There are 2 classes of medicine used for asthma treatment:   Controller medicines. These prevent asthma symptoms from occurring. They are usually taken every day.  Reliever or rescue medicines. These quickly relieve asthma symptoms. They are used as needed and provide short-term relief. Your health care provider will help you create an asthma action plan. An asthma action plan is a written plan for managing and treating your asthma attacks. It includes a list of your asthma triggers and how they may be avoided. It also includes information on when medicines should be taken and when their dosage should be changed. An action plan may also involve the use of a device called a peak flow meter. A peak flow meter measures how well the lungs are working. It helps you monitor your condition. HOME CARE INSTRUCTIONS   Take medicines only as directed by your health care provider. Speak with your health care provider if you have questions about how or when to take the medicines.  Use a peak flow meter as directed by your health care provider. Record and keep track of readings.  Understand and use the action plan to help minimize or stop an asthma attack without needing to seek medical care.  Control  your home environment in the following ways to help prevent asthma attacks:  Do not smoke. Avoid being exposed to secondhand smoke.  Change your heating and air conditioning filter regularly.  Limit your use of fireplaces and  wood stoves.  Get rid of pests (such as roaches and mice) and their droppings.  Throw away plants if you see mold on them.  Clean your floors and dust regularly. Use unscented cleaning products.  Try to have someone else vacuum for you regularly. Stay out of rooms while they are being vacuumed and for a short while afterward. If you vacuum, use a dust mask from a hardware store, a double-layered or microfilter vacuum cleaner bag, or a vacuum cleaner with a HEPA filter.  Replace carpet with wood, tile, or vinyl flooring. Carpet can trap dander and dust.  Use allergy-proof pillows, mattress covers, and box spring covers.  Wash bed sheets and blankets every week in hot water and dry them in a dryer.  Use blankets that are made of polyester or cotton.  Clean bathrooms and kitchens with bleach. If possible, have someone repaint the walls in these rooms with mold-resistant paint. Keep out of the rooms that are being cleaned and painted.  Wash hands frequently. SEEK MEDICAL CARE IF:   You have wheezing, shortness of breath, or a cough even if taking medicine to prevent attacks.  The colored mucus you cough up (sputum) is thicker than usual.  Your sputum changes from clear or white to yellow, green, gray, or bloody.  You have any problems that may be related to the medicines you are taking (such as a rash, itching, swelling, or trouble breathing).  You are using a reliever medicine more than 2-3 times per week.  Your peak flow is still at 50-79% of your personal best after following your action plan for 1 hour.  You have a fever. SEEK IMMEDIATE MEDICAL CARE IF:   You seem to be getting worse and are unresponsive to treatment during an asthma attack.  You are short of breath even at rest.  You get short of breath when doing very little physical activity.  You have difficulty eating, drinking, or talking due to asthma symptoms.  You develop chest pain.  You develop a fast  heartbeat.  You have a bluish color to your lips or fingernails.  You are light-headed, dizzy, or faint.  Your peak flow is less than 50% of your personal best.   This information is not intended to replace advice given to you by your health care provider. Make sure you discuss any questions you have with your health care provider.   Document Released: 08/07/2005 Document Revised: 04/28/2015 Document Reviewed: 03/06/2013 Elsevier Interactive Patient Education [employer]

## 2015-08-01 NOTE — ED Notes (Signed)
Patient states she had a coughing fit this am and was having some SOB Patient does not use a nebulizer at home  Only  Carries a rescue inhaler

## 2015-08-01 NOTE — ED Provider Notes (Signed)
CSN: [id number]     Arrival date & time 08/01/15  1322 History   First MD Initiated Contact with Patient 08/01/15 1422     Chief Complaint  Patient presents with  . Asthma   (Consider location/radiation/quality/duration/timing/severity/associated sxs/prior Treatment) Patient is a 81 y.o. female presenting with cough. The history is provided by the patient. No language interpreter was used.  Cough Cough characteristics:  Productive (but improving) Sputum characteristics:  Green Severity:  Moderate Onset quality:  Gradual Duration:  4 days Timing:  Constant Progression:  Waxing and waning Chronicity:  Recurrent (She has hx of asthma. She has not had issues with her asthma till now) Context: weather changes   Context: not animal exposure, not occupational exposure, not sick contacts and not smoke exposure   Relieved by:  Beta-agonist inhaler and steroid inhaler (Nothing helps, she was started on Z-pak yesterday with minimal improvement) Worsened by:  Activity and exposure to cold air Associated symptoms: shortness of breath and wheezing   Associated symptoms: no chest pain and no fever     Past Medical History  Diagnosis Date  . Acute myocardial infarction, unspecified site, episode of care unspecified   . Unspecified asthma(493.90)   . Unspecified essential hypertension    Past Surgical History  Procedure Laterality Date  . Total abdominal hysterectomy w/ bilateral salpingoophorectomy    . Sinus surgey     Family History  Problem Relation Age of Onset  . Emphysema Father   . Heart disease Maternal Grandmother    Social History  Substance Use Topics  . Smoking status: Never Smoker   . Smokeless tobacco: None  . Alcohol Use: No   OB History    No data available     Review of Systems  Constitutional: Negative for fever.  Respiratory: Positive for cough, shortness of breath and wheezing.   Cardiovascular: Negative.  Negative for chest pain.  Genitourinary: Negative.    All other systems reviewed and are negative.   Allergies  Aspirin and Sulfonamide derivatives  Home Medications   Prior to Admission medications   Medication Sig Start Date End Date Taking? Authorizing Provider  albuterol (PROVENTIL HFA) 108 (90 BASE) MCG/ACT inhaler Inhale 2 puffs into the lungs every 6 (six) hours as needed for wheezing or shortness of breath (Rescue inahaler). 08/02/11 08/01/12  Waymon Budge, MD  budesonide-formoterol (SYMBICORT) 160-4.5 MCG/ACT inhaler Inhale 2 puffs into the lungs 2 (two) times daily. Rinse mouth 08/02/11 08/01/12  Waymon Budge, MD  ergocalciferol (VITAMIN D2) 50000 UNITS capsule Take 50,000 Units by mouth once a week.      Historical Provider, MD  fluticasone (FLONASE) 50 MCG/ACT nasal spray Place 2 sprays into the nose daily. 08/02/11 08/01/12  Waymon Budge, MD  Multiple Vitamin (MULTIVITAMIN) capsule Take 1 capsule by mouth daily.      Historical Provider, MD  telmisartan-hydrochlorothiazide (MICARDIS HCT) 40-12.5 MG per tablet Take 1 tablet by mouth daily.      Historical Provider, MD   Meds Ordered and Administered this Visit  Medications - No data to display  BP 131/85 mmHg  Pulse 97  Temp(Src) 98.1 F (36.7 C) (Oral)  Resp 17  SpO2 100% No data found.   Physical Exam  Constitutional: She appears well-developed. No distress.  Cardiovascular: Normal rate, regular rhythm, normal heart sounds and intact distal pulses.   No murmur heard. Pulmonary/Chest: Effort normal and breath sounds normal. No respiratory distress. She has no rales. She exhibits no tenderness.  Very mild  wheezing  Nursing note and vitals reviewed.   ED Course  Procedures (including critical care time)  Labs Review Labs Reviewed - No data to display  Imaging Review No results found.   Visual Acuity Review  Right Eye Distance:   Left Eye Distance:   Bilateral Distance:    Right Eye Near:   Left Eye Near:    Bilateral Near:         MDM   No diagnosis found. Cough  Mild asthma exacerbation  Patient stable and not in respiratory distress. O2 Sat on RA is 100%. Duoneb treatment given x 1. Course of prednisone prescription given as well as tessalon prn cough. She may complete her Z-Pak. I recommended follow up soon if no improvement. Consider chest xray then. Total encounter time more than 30 min.    Doreene ElandKehinde T Katrinna Travieso, MD 08/01/15 1455

## 2015-12-23 DIAGNOSIS — N183 Chronic kidney disease, stage 3 (moderate): Secondary | ICD-10-CM | POA: Diagnosis not present

## 2015-12-23 DIAGNOSIS — E559 Vitamin D deficiency, unspecified: Secondary | ICD-10-CM | POA: Diagnosis not present

## 2015-12-23 DIAGNOSIS — R946 Abnormal results of thyroid function studies: Secondary | ICD-10-CM | POA: Diagnosis not present

## 2015-12-23 DIAGNOSIS — I129 Hypertensive chronic kidney disease with stage 1 through stage 4 chronic kidney disease, or unspecified chronic kidney disease: Secondary | ICD-10-CM | POA: Diagnosis not present

## 2016-04-28 DIAGNOSIS — I129 Hypertensive chronic kidney disease with stage 1 through stage 4 chronic kidney disease, or unspecified chronic kidney disease: Secondary | ICD-10-CM | POA: Diagnosis not present

## 2016-04-28 DIAGNOSIS — E559 Vitamin D deficiency, unspecified: Secondary | ICD-10-CM | POA: Diagnosis not present

## 2016-04-28 DIAGNOSIS — Z0000 Encounter for general adult medical examination without abnormal findings: Secondary | ICD-10-CM | POA: Diagnosis not present

## 2016-04-28 DIAGNOSIS — N183 Chronic kidney disease, stage 3 (moderate): Secondary | ICD-10-CM | POA: Diagnosis not present

## 2016-04-28 DIAGNOSIS — R7309 Other abnormal glucose: Secondary | ICD-10-CM | POA: Diagnosis not present

## 2016-04-28 DIAGNOSIS — H9192 Unspecified hearing loss, left ear: Secondary | ICD-10-CM | POA: Diagnosis not present

## 2016-04-28 DIAGNOSIS — R946 Abnormal results of thyroid function studies: Secondary | ICD-10-CM | POA: Diagnosis not present

## 2016-05-19 DIAGNOSIS — H903 Sensorineural hearing loss, bilateral: Secondary | ICD-10-CM | POA: Diagnosis not present

## 2016-08-27 DIAGNOSIS — J4521 Mild intermittent asthma with (acute) exacerbation: Secondary | ICD-10-CM | POA: Diagnosis not present

## 2016-08-27 DIAGNOSIS — R05 Cough: Secondary | ICD-10-CM | POA: Diagnosis not present

## 2016-08-27 DIAGNOSIS — R0602 Shortness of breath: Secondary | ICD-10-CM | POA: Diagnosis not present

## 2016-08-30 DIAGNOSIS — J209 Acute bronchitis, unspecified: Secondary | ICD-10-CM | POA: Diagnosis not present

## 2016-08-30 DIAGNOSIS — I129 Hypertensive chronic kidney disease with stage 1 through stage 4 chronic kidney disease, or unspecified chronic kidney disease: Secondary | ICD-10-CM | POA: Diagnosis not present

## 2016-08-30 DIAGNOSIS — J45909 Unspecified asthma, uncomplicated: Secondary | ICD-10-CM | POA: Diagnosis not present

## 2016-08-30 DIAGNOSIS — N183 Chronic kidney disease, stage 3 (moderate): Secondary | ICD-10-CM | POA: Diagnosis not present

## 2017-01-20 DIAGNOSIS — N183 Chronic kidney disease, stage 3 (moderate): Secondary | ICD-10-CM | POA: Diagnosis not present

## 2017-01-20 DIAGNOSIS — I129 Hypertensive chronic kidney disease with stage 1 through stage 4 chronic kidney disease, or unspecified chronic kidney disease: Secondary | ICD-10-CM | POA: Diagnosis not present

## 2017-01-20 DIAGNOSIS — R946 Abnormal results of thyroid function studies: Secondary | ICD-10-CM | POA: Diagnosis not present

## 2017-01-20 DIAGNOSIS — R251 Tremor, unspecified: Secondary | ICD-10-CM | POA: Diagnosis not present

## 2017-01-20 DIAGNOSIS — J309 Allergic rhinitis, unspecified: Secondary | ICD-10-CM | POA: Diagnosis not present

## 2017-09-25 DIAGNOSIS — R946 Abnormal results of thyroid function studies: Secondary | ICD-10-CM | POA: Diagnosis not present

## 2017-09-25 DIAGNOSIS — N183 Chronic kidney disease, stage 3 (moderate): Secondary | ICD-10-CM | POA: Diagnosis not present

## 2017-09-25 DIAGNOSIS — I129 Hypertensive chronic kidney disease with stage 1 through stage 4 chronic kidney disease, or unspecified chronic kidney disease: Secondary | ICD-10-CM | POA: Diagnosis not present

## 2017-09-25 DIAGNOSIS — E785 Hyperlipidemia, unspecified: Secondary | ICD-10-CM | POA: Diagnosis not present

## 2018-02-07 DIAGNOSIS — Z0000 Encounter for general adult medical examination without abnormal findings: Secondary | ICD-10-CM | POA: Diagnosis not present

## 2018-02-07 DIAGNOSIS — I129 Hypertensive chronic kidney disease with stage 1 through stage 4 chronic kidney disease, or unspecified chronic kidney disease: Secondary | ICD-10-CM | POA: Diagnosis not present

## 2018-02-07 DIAGNOSIS — R7309 Other abnormal glucose: Secondary | ICD-10-CM | POA: Diagnosis not present

## 2018-02-07 DIAGNOSIS — E559 Vitamin D deficiency, unspecified: Secondary | ICD-10-CM | POA: Diagnosis not present

## 2018-02-07 DIAGNOSIS — Z1389 Encounter for screening for other disorder: Secondary | ICD-10-CM | POA: Diagnosis not present

## 2018-02-07 DIAGNOSIS — N183 Chronic kidney disease, stage 3 (moderate): Secondary | ICD-10-CM | POA: Diagnosis not present

## 2018-05-16 DIAGNOSIS — Z880 Allergy status to penicillin: Secondary | ICD-10-CM | POA: Diagnosis not present

## 2018-05-16 DIAGNOSIS — I129 Hypertensive chronic kidney disease with stage 1 through stage 4 chronic kidney disease, or unspecified chronic kidney disease: Secondary | ICD-10-CM | POA: Diagnosis not present

## 2018-05-16 DIAGNOSIS — J320 Chronic maxillary sinusitis: Secondary | ICD-10-CM | POA: Diagnosis not present

## 2018-05-16 DIAGNOSIS — N183 Chronic kidney disease, stage 3 (moderate): Secondary | ICD-10-CM | POA: Diagnosis not present

## 2018-08-06 ENCOUNTER — Other Ambulatory Visit: Payer: Self-pay | Admitting: Internal Medicine

## 2018-08-08 ENCOUNTER — Ambulatory Visit: Payer: Self-pay | Admitting: Internal Medicine

## 2018-11-30 ENCOUNTER — Other Ambulatory Visit: Payer: Self-pay | Admitting: Internal Medicine

## 2019-01-28 ENCOUNTER — Other Ambulatory Visit: Payer: Self-pay | Admitting: Internal Medicine

## 2019-02-20 ENCOUNTER — Other Ambulatory Visit: Payer: Self-pay

## 2019-02-20 ENCOUNTER — Ambulatory Visit (INDEPENDENT_AMBULATORY_CARE_PROVIDER_SITE_OTHER): Payer: Medicare Other | Admitting: Internal Medicine

## 2019-02-20 ENCOUNTER — Ambulatory Visit (INDEPENDENT_AMBULATORY_CARE_PROVIDER_SITE_OTHER): Payer: Medicare Other

## 2019-02-20 ENCOUNTER — Encounter: Payer: Self-pay | Admitting: Internal Medicine

## 2019-02-20 VITALS — BP 142/78 | HR 78 | Temp 98.3°F | Ht 60.6 in | Wt 179.2 lb

## 2019-02-20 DIAGNOSIS — N183 Chronic kidney disease, stage 3 unspecified: Secondary | ICD-10-CM

## 2019-02-20 DIAGNOSIS — I129 Hypertensive chronic kidney disease with stage 1 through stage 4 chronic kidney disease, or unspecified chronic kidney disease: Secondary | ICD-10-CM

## 2019-02-20 DIAGNOSIS — J4520 Mild intermittent asthma, uncomplicated: Secondary | ICD-10-CM | POA: Diagnosis not present

## 2019-02-20 DIAGNOSIS — F4321 Adjustment disorder with depressed mood: Secondary | ICD-10-CM

## 2019-02-20 DIAGNOSIS — I10 Essential (primary) hypertension: Secondary | ICD-10-CM

## 2019-02-20 DIAGNOSIS — Z0000 Encounter for general adult medical examination without abnormal findings: Secondary | ICD-10-CM

## 2019-02-20 DIAGNOSIS — E7800 Pure hypercholesterolemia, unspecified: Secondary | ICD-10-CM

## 2019-02-20 DIAGNOSIS — R7309 Other abnormal glucose: Secondary | ICD-10-CM

## 2019-02-20 DIAGNOSIS — E6609 Other obesity due to excess calories: Secondary | ICD-10-CM

## 2019-02-20 DIAGNOSIS — R251 Tremor, unspecified: Secondary | ICD-10-CM

## 2019-02-20 DIAGNOSIS — Z634 Disappearance and death of family member: Secondary | ICD-10-CM

## 2019-02-20 DIAGNOSIS — Z6834 Body mass index (BMI) 34.0-34.9, adult: Secondary | ICD-10-CM

## 2019-02-20 LAB — HEMOGLOBIN A1C
Est. average glucose Bld gHb Est-mCnc: 103 mg/dL
Hgb A1c MFr Bld: 5.2 % (ref 4.8–5.6)

## 2019-02-20 LAB — CMP14+EGFR
ALT: 18 IU/L (ref 0–32)
AST: 27 IU/L (ref 0–40)
Albumin/Globulin Ratio: 1.2 (ref 1.2–2.2)
Albumin: 4.2 g/dL (ref 3.6–4.6)
Alkaline Phosphatase: 49 IU/L (ref 39–117)
BUN/Creatinine Ratio: 24 (ref 12–28)
BUN: 32 mg/dL — ABNORMAL HIGH (ref 8–27)
Bilirubin Total: 0.5 mg/dL (ref 0.0–1.2)
CO2: 22 mmol/L (ref 20–29)
Calcium: 9.9 mg/dL (ref 8.7–10.3)
Chloride: 103 mmol/L (ref 96–106)
Creatinine, Ser: 1.32 mg/dL — ABNORMAL HIGH (ref 0.57–1.00)
GFR calc Af Amer: 42 mL/min/{1.73_m2} — ABNORMAL LOW (ref >59)
GFR calc non Af Amer: 37 mL/min/{1.73_m2} — ABNORMAL LOW (ref >59)
Globulin, Total: 3.5 g/dL (ref 1.5–4.5)
Glucose: 91 mg/dL (ref 65–99)
Potassium: 4.2 mmol/L (ref 3.5–5.2)
Sodium: 139 mmol/L (ref 134–144)
Total Protein: 7.7 g/dL (ref 6.0–8.5)

## 2019-02-20 LAB — POCT URINALYSIS DIPSTICK
Bilirubin, UA: NEGATIVE
Glucose, UA: NEGATIVE
Ketones, UA: NEGATIVE
Nitrite, UA: NEGATIVE
Protein, UA: NEGATIVE
Spec Grav, UA: 1.02 (ref 1.010–1.025)
Urobilinogen, UA: 0.2 E.U./dL
pH, UA: 5.5 (ref 5.0–8.0)

## 2019-02-20 LAB — POCT UA - MICROALBUMIN
Albumin/Creatinine Ratio, Urine, POC: <30
Creatinine, POC: 200 mg/dL
Microalbumin Ur, POC: 10 mg/L

## 2019-02-20 LAB — LIPID PANEL
Chol/HDL Ratio: 3.1 ratio (ref 0.0–4.4)
Cholesterol, Total: 224 mg/dL — ABNORMAL HIGH (ref 100–199)
HDL: 72 mg/dL (ref >39)
LDL Calculated: 136 mg/dL — ABNORMAL HIGH (ref 0–99)
Triglycerides: 78 mg/dL (ref 0–149)
VLDL Cholesterol Cal: 16 mg/dL (ref 5–40)

## 2019-02-20 LAB — CBC
Hematocrit: 34.6 % (ref 34.0–46.6)
Hemoglobin: 11.5 g/dL (ref 11.1–15.9)
MCH: 31.3 pg (ref 26.6–33.0)
MCHC: 33.2 g/dL (ref 31.5–35.7)
MCV: 94 fL (ref 79–97)
Platelets: 231 10*3/uL (ref 150–450)
RBC: 3.67 x10E6/uL — ABNORMAL LOW (ref 3.77–5.28)
RDW: 12 % (ref 11.7–15.4)
WBC: 6.7 10*3/uL (ref 3.4–10.8)

## 2019-02-20 NOTE — Progress Notes (Addendum)
Subjective:     Patient ID: Christina Terry , female    DOB: 1932/01/15 , 85 y.o.   MRN: [id number]   Chief Complaint  Patient presents with  . Hypertension    HPI  Hypertension This is a chronic problem. The current episode started more than 1 year ago. The problem has been gradually improving since onset. Pertinent negatives include no anxiety, blurred vision, palpitations or shortness of breath. Risk factors for coronary artery disease include dyslipidemia, obesity, post-menopausal state and sedentary lifestyle. Past treatments include angiotensin blockers and diuretics.     Past Medical History:  Diagnosis Date  . Acute myocardial infarction, unspecified site, episode of care unspecified   . Unspecified asthma(493.90)   . Unspecified essential hypertension      Family History  Problem Relation Age of Onset  . Emphysema Father   . Alzheimer's disease Father   . Heart disease Maternal Grandmother   . Heart Problems Mother   . Mental illness Mother   . Alzheimer's disease Mother      Current Outpatient Medications:  .  albuterol (PROVENTIL HFA) 108 (90 BASE) MCG/ACT inhaler, Inhale 2 puffs into the lungs every 6 (six) hours as needed for wheezing or shortness of breath (Rescue inahaler)., Disp: 1 Inhaler, Rfl: prn .  benzonatate (TESSALON) 100 MG capsule, Take 1 capsule (100 mg total) by mouth every 8 (eight) hours., Disp: 21 capsule, Rfl: 0 .  budesonide-formoterol (SYMBICORT) 160-4.5 MCG/ACT inhaler, Inhale 2 puffs into the lungs 2 (two) times daily. Rinse mouth, Disp: 1 Inhaler, Rfl: prn .  DULERA 100-5 MCG/ACT AERO, INHALE 2 PUFFS BY MOUTH TWICE A DAY (MORNING AND EVENING), Disp: 13 Inhaler, Rfl: 5 .  valsartan-hydrochlorothiazide (DIOVAN-HCT) 160-12.5 MG tablet, TAKE 1 TABLET BY MOUTH EVERY DAY, Disp: 90 tablet, Rfl: 0   Allergies  Allergen Reactions  . Aspirin     REACTION: wheeze  . Sulfonamide Derivatives      Review of Systems  Constitutional: Negative.    Eyes: Negative for blurred vision.  Respiratory: Negative.  Negative for shortness of breath.   Cardiovascular: Negative.  Negative for palpitations.  Gastrointestinal: Negative.   Neurological: Negative.   Psychiatric/Behavioral: Positive for dysphoric mood.       She is still mourning the loss of her daughter. Feels lonely.      Today's Vitals   02/20/19 0928  BP: (!) 142/78  Pulse: 78  Temp: 98.3 F (36.8 C)  TempSrc: Oral  Weight: 179 lb 3.2 oz (81.3 kg)  Height: 5' 0.6" (1.539 m)  PainSc: 0-No pain   Body mass index is 34.31 kg/m.   Objective:  Physical Exam Vitals signs and nursing note reviewed.  Constitutional:      Appearance: Normal appearance.  HENT:     Head: Normocephalic and atraumatic.  Cardiovascular:     Rate and Rhythm: Normal rate and regular rhythm.     Heart sounds: Normal heart sounds.  Pulmonary:     Effort: Pulmonary effort is normal.     Breath sounds: Normal breath sounds.  Skin:    General: Skin is warm.  Neurological:     General: No focal deficit present.     Mental Status: She is alert.     Comments: Resting tremor b/l hands, more pronounced on the right.   Psychiatric:        Mood and Affect: Mood normal.        Behavior: Behavior normal.         Assessment And  Plan:     1. Hypertensive nephropathy  Fair control. She will continue with current meds for now. She is encouraged to avoid adding salt to her foods. I will check labs as listed below. She will rto in four to six months for re-evaluation.   - CMP14+EGFR - CBC  2. Chronic renal disease, stage III (HCC)  Chronic. She is encouraged to stay well hydrated.   3. Mild intermittent asthma without complication  Chronic, yet stable. She denies use of rescue inhaler.   4. Other abnormal glucose  HER A1C HAS BEEN ELEVATED IN THE PAST. I WILL CHECK AN A1C, BMET TODAY. SHE WAS ENCOURAGED TO AVOID SUGARY BEVERAGES AND PROCESSED FOODS INCLUDNG BREADS, RICE AND PASTA.  -  Hemoglobin A1c  5. Pure hypercholesterolemia  Chronic. I will check fasting lipid panel and LFTs. She is encouraged to limit her intake of fried foods and to incorporate more activity into her daily routine.  - Lipid panel  6. Tremor  She declines Neurology evaluation. I will readdress at her next visit. She is encouraged to pay attention to triggers and relieving factors.   7. Grief at loss of child  She declines referral for grief counseling. I offered CCM services - so she could learn of community resources. She agrees to this.  - Referral to Chronic Care Management Services  6. Class 1 obesity due to excess calories with serious comorbidity and body mass index (BMI) of 34.0 to 34.9 in adult  ,Importance of achieving optimal weight to decrease risk of cardiovascular disease and cancers was discussed with the patient in full detail. She is encouraged to start slowly - start with 10 minutes twice daily at least three to four days per week and to gradually build to 30 minutes five days weekly. She was given tips to incorporate more activity into her daily routine - take stairs when possible, park farther away from grocery stores, etc.     [name], MD   THE PATIENT IS ENCOURAGED TO PRACTICE SOCIAL DISTANCING DUE TO THE COVID-19 PANDEMIC.

## 2019-02-20 NOTE — Progress Notes (Signed)
Subjective:   Christina Terry is a 85 y.o. female who presents for Medicare Annual (Subsequent) preventive examination.  Review of Systems:  n/a Cardiac Risk Factors include: advanced age (>6955men, 97>65 women);obesity (BMI >30kg/m2);hypertension;sedentary lifestyle     Objective:     Vitals: BP (!) 142/78 (Patient Position: Sitting)   Pulse 78   Temp 98.3 F (36.8 C) (Oral)   Ht 5' 0.6" (1.539 m)   Wt 179 lb 3.2 oz (81.3 kg)   BMI 34.31 kg/m   Body mass index is 34.31 kg/m.  Advanced Directives 02/20/2019  Does Patient Have a Medical Advance Directive? Yes  Type of Estate agentAdvance Directive Healthcare Power of [place]der[place]orney;Living will  Copy of Healthcare Power of Attorney in Chart? No - copy requested    Tobacco Social History   Tobacco Use  Smoking Status Never Smoker  Smokeless Tobacco Never Used     Counseling given: Not Answered   Clinical Intake:  Pre-visit preparation completed: Yes  Pain : No/denies pain     Nutritional Status: BMI > 30  Obese Nutritional Risks: None Diabetes: No  How often do you need to have someone help you when you read instructions, pamphlets, or other written materials from your doctor or pharmacy?: 1 - Never What is the last grade level you completed in school?: 12th grade  Interpreter Needed?: No  Information entered by :: NAllen LPN  Past Medical History:  Diagnosis Date  . Acute myocardial infarction, unspecified site, episode of care unspecified   . Unspecified asthma(493.90)   . Unspecified essential hypertension    Past Surgical History:  Procedure Laterality Date  . sinus surgey    . TOTAL ABDOMINAL HYSTERECTOMY W/ BILATERAL SALPINGOOPHORECTOMY     Family History  Problem Relation Age of Onset  . Emphysema Father   . Alzheimer's disease Father   . Heart disease Maternal Grandmother   . Heart Problems Mother   . Mental illness Mother   . Alzheimer's disease Mother    Social History   Socioeconomic History  . Marital  status: Widowed    Spouse name: Not on file  . Number of children: Not on file  . Years of education: Not on file  . Highest education level: Not on file  Occupational History  . Occupation: [employer]  Social Needs  . Financial resource strain: Not hard at all  . Food insecurity    Worry: Never true    Inability: Never true  . Transportation needs    Medical: No    Non-medical: No  Tobacco Use  . Smoking status: Never Smoker  . Smokeless tobacco: Never Used  Substance and Sexual Activity  . Alcohol use: No  . Drug use: No  . Sexual activity: Not Currently  Lifestyle  . Physical activity    Days per week: 0 days    Minutes per session: 0 min  . Stress: Not at all  Relationships  . Social Musicianconnections    Talks on phone: Not on file    Gets together: Not on file    Attends religious service: Not on file    Active member of club or organization: Not on file    Attends meetings of clubs or organizations: Not on file    Relationship status: Not on file  Other Topics Concern  . Not on file  Social History Narrative  . Not on file    Outpatient Encounter Medications as of 02/20/2019  Medication Sig  . albuterol (PROVENTIL HFA) 108 (90  BASE) MCG/ACT inhaler Inhale 2 puffs into the lungs every 6 (six) hours as needed for wheezing or shortness of breath (Rescue inahaler).  . benzonatate (TESSALON) 100 MG capsule Take 1 capsule (100 mg total) by mouth every 8 (eight) hours.  . budesonide-formoterol (SYMBICORT) 160-4.5 MCG/ACT inhaler Inhale 2 puffs into the lungs 2 (two) times daily. Rinse mouth  . DULERA 100-5 MCG/ACT AERO INHALE 2 PUFFS BY MOUTH TWICE A DAY (MORNING AND EVENING)  . valsartan-hydrochlorothiazide (DIOVAN-HCT) 160-12.5 MG tablet TAKE 1 TABLET BY MOUTH EVERY DAY   No facility-administered encounter medications on file as of 02/20/2019.     Activities of Daily Living In your present state of health, do you have any difficulty performing the following activities: 02/20/2019   Hearing? Y  Vision? N  Difficulty concentrating or making decisions? N  Walking or climbing stairs? N  Dressing or bathing? N  Doing errands, shopping? N  Preparing Food and eating ? N  Using the Toilet? N  In the past six months, have you accidently leaked urine? N  Do you have problems with loss of bowel control? N  Managing your Medications? N  Managing your Finances? N  Housekeeping or managing your Housekeeping? N  Some recent data might be hidden    Patient Care Team: [name], MD as PCP - General (Internal Medicine)    Assessment:   This is a routine wellness examination for [name].  Exercise Activities and Dietary recommendations Current Exercise Habits: The patient does not participate in regular exercise at present  Goals    . DIET - INCREASE WATER INTAKE       Fall Risk Fall Risk  02/20/2019 02/20/2019  Falls in the past year? 0 0  Risk for fall due to : Medication side effect -  Follow up Falls evaluation completed;Education provided;Falls prevention discussed -   Is the patient's home free of loose throw rugs in walkways, pet beds, electrical cords, etc?   yes      Grab bars in the bathroom? yes      Handrails on the stairs?   yes      Adequate lighting?   yes  Timed Get Up and Go performed: n/a  Depression Screen PHQ 2/9 Scores 02/20/2019 02/20/2019  PHQ - 2 Score 0 0     Cognitive Function     6CIT Screen 02/20/2019  What Year? 0 points  What month? 0 points  What time? 3 points  Count back from 20 0 points  Months in reverse 2 points  Repeat phrase 0 points  Total Score 5    Immunization History  Administered Date(s) Administered  . Influenza Split 08/02/2011  . Influenza Whole 05/19/2009, 05/21/2010  . Influenza, High Dose Seasonal PF 06/04/2018  . Pneumococcal Conjugate-13 06/26/2018  . Pneumococcal Polysaccharide-23 05/19/2009    Qualifies for Shingles Vaccine? yes  Screening Tests Health Maintenance  Topic Date Due  . INFLUENZA  VACCINE  03/22/2019  . TETANUS/TDAP  09/28/2021  . DEXA SCAN  Completed  . PNA vac Low Risk Adult  Completed    Cancer Screenings: Lung: Low Dose CT Chest recommended if Age 88-80 years, 30 pack-year currently smoking OR have quit w/in 15years. Patient does not qualify. Breast:  Up to date on Mammogram? Yes   Up to date of Bone Density/Dexa? Yes Colorectal: not required  Additional Screenings: : Hepatitis C Screening: n/a     Plan:    6 CIT was 5. Wants to increase water intake.  I have personally reviewed and noted the following in the patient's chart:   . Medical and social history . Use of alcohol, tobacco or illicit drugs  . Current medications and supplements . Functional ability and status . Nutritional status . Physical activity . Advanced directives . List of other physicians . Hospitalizations, surgeries, and ER visits in previous 12 months . Vitals . Screenings to include cognitive, depression, and falls . Referrals and appointments  In addition, I have reviewed and discussed with patient certain preventive protocols, quality metrics, and best practice recommendations. A written personalized care plan for preventive services as well as general preventive health recommendations were provided to patient.     Barb Merinoickeah E Rosaire Cueto, LPN  1/6/10967/09/2018

## 2019-02-20 NOTE — Patient Instructions (Signed)
[name] , Thank you for taking time to come for your Medicare Wellness Visit. I appreciate your ongoing commitment to your health goals. Please review the following plan we discussed and let me know if I can assist you in the future.   Screening recommendations/referrals: Colonoscopy: not required Mammogram: not required Bone Density: states had Recommended yearly ophthalmology/optometry visit for glaucoma screening and checkup Recommended yearly dental visit for hygiene and checkup  Vaccinations: Influenza vaccine: 05/2018 Pneumococcal vaccine: 06/2018 Tdap vaccine: 09/2011 Shingles vaccine: discussed    Advanced directives: Please bring a copy of your POA (Power of [place]) and/or Living Will to your next appointment.    Conditions/risks identified: obesity  Next appointment: 05/28/2019 at 10:30   Preventive Care 5 Years and Older, Female Preventive care refers to lifestyle choices and visits with your health care provider that can promote health and wellness. What does preventive care include?  A yearly physical exam. This is also called an annual well check.  Dental exams once or twice a year.  Routine eye exams. Ask your health care provider how often you should have your eyes checked.  Personal lifestyle choices, including:  Daily care of your teeth and gums.  Regular physical activity.  Eating a healthy diet.  Avoiding tobacco and drug use.  Limiting alcohol use.  Practicing safe sex.  Taking low-dose aspirin every day.  Taking vitamin and mineral supplements as recommended by your health care provider. What happens during an annual well check? The services and screenings done by your health care provider during your annual well check will depend on your age, overall health, lifestyle risk factors, and family history of disease. Counseling  Your health care provider may ask you questions about your:  Alcohol use.  Tobacco use.  Drug use.   Emotional well-being.  Home and relationship well-being.  Sexual activity.  Eating habits.  History of falls.  Memory and ability to understand (cognition).  Work and work Statistician.  Reproductive health. Screening  You may have the following tests or measurements:  Height, weight, and BMI.  Blood pressure.  Lipid and cholesterol levels. These may be checked every 5 years, or more frequently if you are over 68 years old.  Skin check.  Lung cancer screening. You may have this screening every year starting at age 61 if you have a 30-pack-year history of smoking and currently smoke or have quit within the past 15 years.  Fecal occult blood test (FOBT) of the stool. You may have this test every year starting at age 64.  Flexible sigmoidoscopy or colonoscopy. You may have a sigmoidoscopy every 5 years or a colonoscopy every 10 years starting at age 1.  Hepatitis C blood test.  Hepatitis B blood test.  Sexually transmitted disease (STD) testing.  Diabetes screening. This is done by checking your blood sugar (glucose) after you have not eaten for a while (fasting). You may have this done every 1-3 years.  Bone density scan. This is done to screen for osteoporosis. You may have this done starting at age 82.  Mammogram. This may be done every 1-2 years. Talk to your health care provider about how often you should have regular mammograms. Talk with your health care provider about your test results, treatment options, and if necessary, the need for more tests. Vaccines  Your health care provider may recommend certain vaccines, such as:  Influenza vaccine. This is recommended every year.  Tetanus, diphtheria, and acellular pertussis (Tdap, Td) vaccine. You may need  a Td booster every 10 years.  Zoster vaccine. You may need this after age 1.  Pneumococcal 13-valent conjugate (PCV13) vaccine. One dose is recommended after age 75.  Pneumococcal polysaccharide (PPSV23)  vaccine. One dose is recommended after age 61. Talk to your health care provider about which screenings and vaccines you need and how often you need them. This information is not intended to replace advice given to you by your health care provider. Make sure you discuss any questions you have with your health care provider. Document Released: 09/03/2015 Document Revised: 04/26/2016 Document Reviewed: 06/08/2015 Elsevier Interactive Patient Education  2017 [employer] Prevention in the Home Falls can cause injuries. They can happen to people of all ages. There are many things you can do to make your home safe and to help prevent falls. What can I do on the outside of my home?  Regularly fix the edges of walkways and driveways and fix any cracks.  Remove anything that might make you trip as you walk through a door, such as a raised step or threshold.  Trim any bushes or trees on the path to your home.  Use bright outdoor lighting.  Clear any walking paths of anything that might make someone trip, such as rocks or tools.  Regularly check to see if handrails are loose or broken. Make sure that both sides of any steps have handrails.  Any raised decks and porches should have guardrails on the edges.  Have any leaves, snow, or ice cleared regularly.  Use sand or salt on walking paths during winter.  Clean up any spills in your garage right away. This includes oil or grease spills. What can I do in the bathroom?  Use night lights.  Install grab bars by the toilet and in the tub and shower. Do not use towel bars as grab bars.  Use non-skid mats or decals in the tub or shower.  If you need to sit down in the shower, use a plastic, non-slip stool.  Keep the floor dry. Clean up any water that spills on the floor as soon as it happens.  Remove soap buildup in the tub or shower regularly.  Attach bath mats securely with double-sided non-slip rug tape.  Do not have throw rugs  and other things on the floor that can make you trip. What can I do in the bedroom?  Use night lights.  Make sure that you have a light by your bed that is easy to reach.  Do not use any sheets or blankets that are too big for your bed. They should not hang down onto the floor.  Have a firm chair that has side arms. You can use this for support while you get dressed.  Do not have throw rugs and other things on the floor that can make you trip. What can I do in the kitchen?  Clean up any spills right away.  Avoid walking on wet floors.  Keep items that you use a lot in easy-to-reach places.  If you need to reach something above you, use a strong step stool that has a grab bar.  Keep electrical cords out of the way.  Do not use floor polish or wax that makes floors slippery. If you must use wax, use non-skid floor wax.  Do not have throw rugs and other things on the floor that can make you trip. What can I do with my stairs?  Do not leave any items on the  stairs.  Make sure that there are handrails on both sides of the stairs and use them. Fix handrails that are broken or loose. Make sure that handrails are as long as the stairways.  Check any carpeting to make sure that it is firmly attached to the stairs. Fix any carpet that is loose or worn.  Avoid having throw rugs at the top or bottom of the stairs. If you do have throw rugs, attach them to the floor with carpet tape.  Make sure that you have a light switch at the top of the stairs and the bottom of the stairs. If you do not have them, ask someone to add them for you. What else can I do to help prevent falls?  Wear shoes that:  Do not have high heels.  Have rubber bottoms.  Are comfortable and fit you well.  Are closed at the toe. Do not wear sandals.  If you use a stepladder:  Make sure that it is fully opened. Do not climb a closed stepladder.  Make sure that both sides of the stepladder are locked into  place.  Ask someone to hold it for you, if possible.  Clearly mark and make sure that you can see:  Any grab bars or handrails.  First and last steps.  Where the edge of each step is.  Use tools that help you move around (mobility aids) if they are needed. These include:  Canes.  Walkers.  Scooters.  Crutches.  Turn on the lights when you go into a dark area. Replace any light bulbs as soon as they burn out.  Set up your furniture so you have a clear path. Avoid moving your furniture around.  If any of your floors are uneven, fix them.  If there are any pets around you, be aware of where they are.  Review your medicines with your doctor. Some medicines can make you feel dizzy. This can increase your chance of falling. Ask your doctor what other things that you can do to help prevent falls. This information is not intended to replace advice given to you by your health care provider. Make sure you discuss any questions you have with your health care provider. Document Released: 06/03/2009 Document Revised: 01/13/2016 Document Reviewed: 09/11/2014 Elsevier Interactive Patient Education  2017 [employer].

## 2019-02-20 NOTE — Patient Instructions (Signed)
Asthma, Adult ° °Asthma is a long-term (chronic) condition in which the airways get tight and narrow. The airways are the breathing passages that lead from the nose and mouth down into the lungs. A person with asthma will have times when symptoms get worse. These are called asthma attacks. They can cause coughing, whistling sounds when you breathe (wheezing), shortness of breath, and chest pain. They can make it hard to breathe. There is no cure for asthma, but medicines and lifestyle changes can help control it. °There are many things that can bring on an asthma attack or make asthma symptoms worse (triggers). Common triggers include: °· Mold. °· Dust. °· Cigarette smoke. °· Cockroaches. °· Things that can cause allergy symptoms (allergens). These include animal skin flakes (dander) and pollen from trees or grass. °· Things that pollute the air. These may include household cleaners, wood smoke, smog, or chemical odors. °· Cold air, weather changes, and wind. °· Crying or laughing hard. °· Stress. °· Certain medicines or drugs. °· Certain foods such as dried fruit, potato chips, and grape juice. °· Infections, such as a cold or the flu. °· Certain medical conditions or diseases. °· Exercise or tiring activities. °Asthma may be treated with medicines and by staying away from the things that cause asthma attacks. Types of medicines may include: °· Controller medicines. These help prevent asthma symptoms. They are usually taken every day. °· Fast-acting reliever or rescue medicines. These quickly relieve asthma symptoms. They are used as needed and provide short-term relief. °· Allergy medicines if your attacks are brought on by allergens. °· Medicines to help control the body's defense (immune) system. °Follow these instructions at home: °Avoiding triggers in your home °· Change your heating and air conditioning filter often. °· Limit your use of fireplaces and wood stoves. °· Get rid of pests (such as roaches and  mice) and their droppings. °· Throw away plants if you see mold on them. °· Clean your floors. Dust regularly. Use cleaning products that do not smell. °· Have someone vacuum when you are not home. Use a vacuum cleaner with a HEPA filter if possible. °· Replace carpet with wood, tile, or vinyl flooring. Carpet can trap animal skin flakes and dust. °· Use allergy-proof pillows, mattress covers, and box spring covers. °· Wash bed sheets and blankets every week in hot water. Dry them in a dryer. °· Keep your bedroom free of any triggers. °· Avoid pets and keep windows closed when things that cause allergy symptoms are in the air. °· Use blankets that are made of polyester or cotton. °· Clean bathrooms and kitchens with bleach. If possible, have someone repaint the walls in these rooms with mold-resistant paint. Keep out of the rooms that are being cleaned and painted. °· Wash your hands often with soap and water. If soap and water are not available, use hand sanitizer. °· Do not allow anyone to smoke in your home. °General instructions °· Take over-the-counter and prescription medicines only as told by your doctor. °? Talk with your doctor if you have questions about how or when to take your medicines. °? Make note if you need to use your medicines more often than usual. °· Do not use any products that contain nicotine or tobacco, such as cigarettes and e-cigarettes. If you need help quitting, ask your doctor. °· Stay away from secondhand smoke. °· Avoid doing things outdoors when allergen counts are high and when air quality is low. °· Wear a ski mask   when doing outdoor activities in the winter. The mask should cover your nose and mouth. Exercise indoors on cold days if you can. °· Warm up before you exercise. Take time to cool down after exercise. °· Use a peak flow meter as told by your doctor. A peak flow meter is a tool that measures how well the lungs are working. °· Keep track of the peak flow meter's readings.  Write them down. °· Follow your asthma action plan. This is a written plan for taking care of your asthma and treating your attacks. °· Make sure you get all the shots (vaccines) that your doctor recommends. Ask your doctor about a flu shot and a pneumonia shot. °· Keep all follow-up visits as told by your doctor. This is important. °Contact a doctor if: °· You have wheezing, shortness of breath, or a cough even while taking medicine to prevent attacks. °· The mucus you cough up (sputum) is thicker than usual. °· The mucus you cough up changes from clear or white to yellow, green, gray, or bloody. °· You have problems from the medicine you are taking, such as: °? A rash. °? Itching. °? Swelling. °? Trouble breathing. °· You need reliever medicines more than 2-3 times a week. °· Your peak flow reading is still at 50-79% of your personal best after following the action plan for 1 hour. °· You have a fever. °Get help right away if: °· You seem to be worse and are not responding to medicine during an asthma attack. °· You are short of breath even at rest. °· You get short of breath when doing very little activity. °· You have trouble eating, drinking, or talking. °· You have chest pain or tightness. °· You have a fast heartbeat. °· Your lips or fingernails start to turn blue. °· You are light-headed or dizzy, or you faint. °· Your peak flow is less than 50% of your personal best. °· You feel too tired to breathe normally. °Summary °· Asthma is a long-term (chronic) condition in which the airways get tight and narrow. An asthma attack can make it hard to breathe. °· Asthma cannot be cured, but medicines and lifestyle changes can help control it. °· Make sure you understand how to avoid triggers and how and when to use your medicines. °This information is not intended to replace advice given to you by your health care provider. Make sure you discuss any questions you have with your health care provider. °Document  Released: 01/24/2008 Document Revised: 10/10/2018 Document Reviewed: 09/11/2016 °Elsevier Patient Education © 2020 Elsevier Inc. ° °

## 2019-02-27 ENCOUNTER — Ambulatory Visit: Payer: Self-pay

## 2019-02-27 NOTE — Chronic Care Management (AMB) (Signed)
  Chronic Care Management   Outreach Note  02/27/2019 Name: Christina Terry MRN: [id number] DOB: 04-24-1932  Referred by: [name], MD Reason for referral : Care Coordination   An unsuccessful telephone outreach was attempted today. The patient was referred to the case management team by for assistance with chronic care management and care coordination.   Follow Up Plan: A HIPPA compliant phone message was left for the patient providing contact information and requesting a return call.  The care management team will reach out to the patient again over the next 10 days.   Daneen Schick, BSW, CDP Social Worker, Certified Dementia Practitioner [place] [facility] [phone]

## 2019-03-03 ENCOUNTER — Ambulatory Visit: Payer: Self-pay

## 2019-03-03 NOTE — Chronic Care Management (AMB) (Signed)
  Care Management Note   Christina Terry is a 85 y.o. year old female who is a primary care patient of [name], MD . The CM team was consulted for assistance with disease management.  Review of patient status, including review of consultants reports, rand collaboration with appropriate care team members and the patient's provider was performed as part of comprehensive patient evaluation and provision of care management services. Telephone outreach to patient today to introduce CM services.   I reached out to [facility] by phone today.   [name] was given information about Chronic Care Management services today including:  1. CCM service includes personalized support from designated clinical staff supervised by her physician, including individualized plan of care and coordination with other care providers 2. 24/7 contact phone numbers for assistance for urgent and routine care needs. 3. Service will only be billed when office clinical staff spend 20 minutes or more in a month to coordinate care. 4. Only one practitioner may furnish and bill the service in a calendar month. 5. The patient may stop CCM services at any time (effective at the end of the month) by phone call to the office staff. 6. The patient will be responsible for cost sharing (co-pay) of up to 20% of the service fee (after annual deductible is met).   Patient did not agree to enrollment in care management services and does not wish to consider at this time.   During today's call the patient reports her sister whom lives out of state is very ill and not doing well. The patient reports she plans to go visit her sister and may contact CCM SW when she returns to enroll. CCM SW discussed scheduling a follow up call to discuss enrollment but the patient declined. The patient was notable upset during today's call. CCM SW contacted the patients provider via secure chat to update on the patients declination and report of sisters  health.   Follow Up Plan: No follow up planned at this time. The patient reports she will contact CCM SW "upon return" to enroll if she feels it is necessary.   Daneen Schick, BSW, CDP Social Worker, Certified Dementia Practitioner [facility] [phone]

## 2019-04-25 ENCOUNTER — Other Ambulatory Visit: Payer: Self-pay | Admitting: Internal Medicine

## 2019-05-19 ENCOUNTER — Encounter: Payer: Self-pay | Admitting: Internal Medicine

## 2019-05-28 ENCOUNTER — Other Ambulatory Visit: Payer: Self-pay

## 2019-05-28 ENCOUNTER — Encounter: Payer: Self-pay | Admitting: Internal Medicine

## 2019-05-28 ENCOUNTER — Ambulatory Visit (INDEPENDENT_AMBULATORY_CARE_PROVIDER_SITE_OTHER): Payer: Medicare Other | Admitting: Internal Medicine

## 2019-05-28 VITALS — BP 138/70 | HR 74 | Temp 97.8°F | Ht 60.6 in | Wt 179.6 lb

## 2019-05-28 DIAGNOSIS — E6609 Other obesity due to excess calories: Secondary | ICD-10-CM

## 2019-05-28 DIAGNOSIS — I129 Hypertensive chronic kidney disease with stage 1 through stage 4 chronic kidney disease, or unspecified chronic kidney disease: Secondary | ICD-10-CM

## 2019-05-28 DIAGNOSIS — N1832 Chronic kidney disease, stage 3b: Secondary | ICD-10-CM

## 2019-05-28 DIAGNOSIS — R251 Tremor, unspecified: Secondary | ICD-10-CM

## 2019-05-28 DIAGNOSIS — Z6834 Body mass index (BMI) 34.0-34.9, adult: Secondary | ICD-10-CM

## 2019-05-28 DIAGNOSIS — R946 Abnormal results of thyroid function studies: Secondary | ICD-10-CM | POA: Diagnosis not present

## 2019-05-28 NOTE — Patient Instructions (Signed)

## 2019-05-29 LAB — TSH: TSH: 0.338 u[IU]/mL — ABNORMAL LOW (ref 0.450–4.500)

## 2019-05-30 LAB — SPECIMEN STATUS REPORT

## 2019-05-30 LAB — T4, FREE: Free T4: 1.29 ng/dL (ref 0.82–1.77)

## 2019-06-02 DIAGNOSIS — I129 Hypertensive chronic kidney disease with stage 1 through stage 4 chronic kidney disease, or unspecified chronic kidney disease: Secondary | ICD-10-CM | POA: Insufficient documentation

## 2019-06-02 DIAGNOSIS — R251 Tremor, unspecified: Secondary | ICD-10-CM | POA: Insufficient documentation

## 2019-06-02 NOTE — Progress Notes (Signed)
Subjective:     Patient ID: Christina Terry , female    DOB: 31-Jul-1932 , 85 y.o.   MRN: [id number]   Chief Complaint  Patient presents with  . Hypertension    HPI  Hypertension This is a chronic problem. The current episode started more than 1 year ago. The problem has been gradually improving since onset. The problem is controlled. Pertinent negatives include no blurred vision, chest pain, palpitations or shortness of breath. Risk factors for coronary artery disease include obesity, post-menopausal state and sedentary lifestyle. Past treatments include angiotensin blockers. The current treatment provides moderate improvement. Compliance problems include exercise.      Past Medical History:  Diagnosis Date  . Acute myocardial infarction, unspecified site, episode of care unspecified   . Unspecified asthma(493.90)   . Unspecified essential hypertension      Family History  Problem Relation Age of Onset  . Emphysema Father   . Alzheimer's disease Father   . Heart disease Maternal Grandmother   . Heart Problems Mother   . Mental illness Mother   . Alzheimer's disease Mother      Current Outpatient Medications:  .  albuterol (PROVENTIL HFA) 108 (90 BASE) MCG/ACT inhaler, Inhale 2 puffs into the lungs every 6 (six) hours as needed for wheezing or shortness of breath (Rescue inahaler)., Disp: 1 Inhaler, Rfl: prn .  benzonatate (TESSALON) 100 MG capsule, Take 1 capsule (100 mg total) by mouth every 8 (eight) hours., Disp: 21 capsule, Rfl: 0 .  DULERA 100-5 MCG/ACT AERO, INHALE 2 PUFFS BY MOUTH TWICE A DAY (MORNING AND EVENING), Disp: 13 Inhaler, Rfl: 5 .  valsartan-hydrochlorothiazide (DIOVAN-HCT) 160-12.5 MG tablet, TAKE 1 TABLET BY MOUTH EVERY DAY, Disp: 90 tablet, Rfl: 0   Allergies  Allergen Reactions  . Aspirin     REACTION: wheeze  . Sulfonamide Derivatives      Review of Systems  Constitutional: Negative.   Eyes: Negative for blurred vision.  Respiratory: Negative.   Negative for shortness of breath.   Cardiovascular: Negative.  Negative for chest pain and palpitations.  Gastrointestinal: Negative.   Neurological: Negative.   Psychiatric/Behavioral: Negative.      Today's Vitals   05/28/19 1137  BP: 138/70  Pulse: 74  Temp: 97.8 F (36.6 C)  TempSrc: Oral  SpO2: 97%  Weight: 179 lb 9.6 oz (81.5 kg)  Height: 5' 0.6" (1.539 m)   Body mass index is 34.38 kg/m.   Objective:  Physical Exam Vitals signs and nursing note reviewed.  Constitutional:      Appearance: Normal appearance.  HENT:     Head: Normocephalic and atraumatic.  Cardiovascular:     Rate and Rhythm: Normal rate and regular rhythm.     Heart sounds: Normal heart sounds.  Pulmonary:     Effort: Pulmonary effort is normal.     Breath sounds: Normal breath sounds.  Skin:    General: Skin is warm.  Neurological:     General: No focal deficit present.     Mental Status: She is alert.     Comments: Resting tremor RUE  Psychiatric:        Mood and Affect: Mood normal.        Behavior: Behavior normal.         Assessment And Plan:     1. Hypertensive nephropathy  Chronic, fair control. She will continue with current meds. She is encouraged to avoid adding salt to her foods and to incorporate more exercise into her daily routine.  2. Stage 3b chronic kidney disease  Chronic, yet stable. She is encouraged to drink at least 48 ounces of water daily.   3. Tremor  I will check thyroid function. I will check additional thyroid labs as needed.   - TSH  4. Class 1 obesity due to excess calories with serious comorbidity and body mass index (BMI) of 34.0 to 34.9 in adult  .Importance of achieving optimal weight to decrease risk of cardiovascular disease and cancers was discussed with the patient in full detail. She is encouraged to start slowly - start with 10 minutes twice daily at least three to four days per week and to gradually build to 30 minutes five days weekly.  She was given tips to incorporate more activity into her daily routine - take stairs when possible, park farther away from grocery stores, etc.    [name], MD    THE PATIENT IS ENCOURAGED TO Pge 3a or 3b CKDRACTICE SOCIAL DISTANCING DUE TO THE COVID-19 PANDEMIC.

## 2019-08-07 ENCOUNTER — Other Ambulatory Visit: Payer: Self-pay | Admitting: Internal Medicine

## 2019-09-12 ENCOUNTER — Ambulatory Visit: Payer: Commercial Managed Care - HMO | Attending: Internal Medicine

## 2019-09-12 DIAGNOSIS — Z23 Encounter for immunization: Secondary | ICD-10-CM | POA: Insufficient documentation

## 2019-09-15 NOTE — Progress Notes (Signed)
   Covid-19 Vaccination Clinic  Name:  Lucynda Rosano    MRN: [id number] DOB: 1932-01-06  09/12/2019  Ms. Carrell was observed post Covid-19 immunization for 15 minutes without incidence. She was provided with Vaccine Information Sheet and instruction to access the V-Safe system.   Ms. Shook was instructed to call 911 with any severe reactions post vaccine: Marland Kitchen Difficulty breathing  . Swelling of your face and throat  . A fast heartbeat  . A bad rash all over your body  . Dizziness and weakness    Immunizations Administered    Name Date Dose VIS Date Route   Moderna COVID-19 Vaccine 09/12/2019 10:46 AM 0.5 mL 07/22/2019 Intramuscular   Manufacturer: Moderna   Lot: [id number]   NDC: 00979-499-71

## 2019-09-29 ENCOUNTER — Other Ambulatory Visit: Payer: Self-pay

## 2019-09-29 ENCOUNTER — Ambulatory Visit (INDEPENDENT_AMBULATORY_CARE_PROVIDER_SITE_OTHER): Payer: Medicare Other | Admitting: Internal Medicine

## 2019-09-29 ENCOUNTER — Encounter: Payer: Self-pay | Admitting: Internal Medicine

## 2019-09-29 VITALS — BP 124/70 | HR 76 | Temp 97.8°F | Ht 60.6 in | Wt 180.8 lb

## 2019-09-29 DIAGNOSIS — N1832 Chronic kidney disease, stage 3b: Secondary | ICD-10-CM | POA: Diagnosis not present

## 2019-09-29 DIAGNOSIS — I129 Hypertensive chronic kidney disease with stage 1 through stage 4 chronic kidney disease, or unspecified chronic kidney disease: Secondary | ICD-10-CM | POA: Diagnosis not present

## 2019-09-29 DIAGNOSIS — E6609 Other obesity due to excess calories: Secondary | ICD-10-CM

## 2019-09-29 DIAGNOSIS — E7800 Pure hypercholesterolemia, unspecified: Secondary | ICD-10-CM | POA: Diagnosis not present

## 2019-09-29 DIAGNOSIS — Z6834 Body mass index (BMI) 34.0-34.9, adult: Secondary | ICD-10-CM

## 2019-09-29 MED ORDER — VALSARTAN-HYDROCHLOROTHIAZIDE 160-12.5 MG PO TABS
1.0000 | ORAL_TABLET | Freq: Every day | ORAL | 2 refills | Status: DC
Start: 2019-09-29 — End: 2020-08-16

## 2019-09-29 MED ORDER — DULERA 100-5 MCG/ACT IN AERO: 2.0000 | INHALATION_SPRAY | Freq: Two times a day (BID) | RESPIRATORY_TRACT | 6 refills | Status: DC

## 2019-09-29 NOTE — Patient Instructions (Addendum)
Hypertension, Adult Hypertension is another name for high blood pressure. High blood pressure forces your heart to work harder to pump blood. This can cause problems over time. There are two numbers in a blood pressure reading. There is a top number (systolic) over a bottom number (diastolic). It is best to have a blood pressure that is below 120/80. Healthy choices can help lower your blood pressure, or you may need medicine to help lower it. What are the causes? The cause of this condition is not known. Some conditions may be related to high blood pressure. What increases the risk?  Smoking.  Having type 2 diabetes mellitus, high cholesterol, or both.  Not getting enough exercise or physical activity.  Being overweight.  Having too much fat, sugar, calories, or salt (sodium) in your diet.  Drinking too much alcohol.  Having long-term (chronic) kidney disease.  Having a family history of high blood pressure.  Age. Risk increases with age.  Race. You may be at higher risk if you are African American.  Gender. Men are at higher risk than women before age 8. After age 41, women are at higher risk than men.  Having obstructive sleep apnea.  Stress. What are the signs or symptoms?  High blood pressure may not cause symptoms. Very high blood pressure (hypertensive crisis) may cause: ? Headache. ? Feelings of worry or nervousness (anxiety). ? Shortness of breath. ? Nosebleed. ? A feeling of being sick to your stomach (nausea). ? Throwing up (vomiting). ? Changes in how you see. ? Very bad chest pain. ? Seizures. How is this treated?  This condition is treated by making healthy lifestyle changes, such as: ? Eating healthy foods. ? Exercising more. ? Drinking less alcohol.  Your health care provider may prescribe medicine if lifestyle changes are not enough to get your blood pressure under control, and if: ? Your top number is above 130. ? Your bottom number is above  80.  Your personal target blood pressure may vary. Follow these instructions at home: Eating and drinking   If told, follow the DASH eating plan. To follow this plan: ? Fill one half of your plate at each meal with fruits and vegetables. ? Fill one fourth of your plate at each meal with whole grains. Whole grains include whole-wheat pasta, brown rice, and whole-grain bread. ? Eat or drink low-fat dairy products, such as skim milk or low-fat yogurt. ? Fill one fourth of your plate at each meal with low-fat (lean) proteins. Low-fat proteins include fish, chicken without skin, eggs, beans, and tofu. ? Avoid fatty meat, cured and processed meat, or chicken with skin. ? Avoid pre-made or processed food.  Eat less than 1,500 mg of salt each day.  Do not drink alcohol if: ? Your doctor tells you not to drink. ? You are pregnant, may be pregnant, or are planning to become pregnant.  If you drink alcohol: ? Limit how much you use to:  0-1 drink a day for women.  0-2 drinks a day for men. ? Be aware of how much alcohol is in your drink. In the [place] one drink equals one 12 oz bottle of beer (355 mL), one 5 oz glass of wine (148 mL), or one 1 oz glass of hard liquor (44 mL). Lifestyle   Work with your doctor to stay at a healthy weight or to lose weight. Ask your doctor what the best weight is for you.  Get at least 30 minutes of exercise most  days of the week. This may include walking, swimming, or biking.  Get at least 30 minutes of exercise that strengthens your muscles (resistance exercise) at least 3 days a week. This may include lifting weights or doing Pilates.  Do not use any products that contain nicotine or tobacco, such as cigarettes, e-cigarettes, and chewing tobacco. If you need help quitting, ask your doctor.  Check your blood pressure at home as told by your doctor.  Keep all follow-up visits as told by your doctor. This is important. Medicines  Take over-the-counter  and prescription medicines only as told by your doctor. Follow directions carefully.  Do not skip doses of blood pressure medicine. The medicine does not work as well if you skip doses. Skipping doses also puts you at risk for problems.  Ask your doctor about side effects or reactions to medicines that you should watch for. Contact a doctor if you:  Think you are having a reaction to the medicine you are taking.  Have headaches that keep coming back (recurring).  Feel dizzy.  Have swelling in your ankles.  Have trouble with your vision. Get help right away if you:  Get a very bad headache.  Start to feel mixed up (confused).  Feel weak or numb.  Feel faint.  Have very bad pain in your: ? Chest. ? Belly (abdomen).  Throw up more than once.  Have trouble breathing. Summary  Hypertension is another name for high blood pressure.  High blood pressure forces your heart to work harder to pump blood.  For most people, a normal blood pressure is less than 120/80.  Making healthy choices can help lower blood pressure. If your blood pressure does not get lower with healthy choices, you may need to take medicine. This information is not intended to replace advice given to you by your health care provider. Make sure you discuss any questions you have with your health care provider. Document Revised: 04/17/2018 Document Reviewed: 04/17/2018 Elsevier Patient Education  [place].   Exercises To Do While Sitting  Exercises that you do while sitting (chair exercises) can give you many of the same benefits as full exercise. Benefits include strengthening your heart, burning calories, and keeping muscles and joints healthy. Exercise can also improve your mood and help with depression and anxiety. You may benefit from chair exercises if you are unable to do standing exercises because of:  Diabetic foot pain.  Obesity.  Illness.  Arthritis.  Recovery from surgery or  injury.  Breathing problems.  Balance problems.  Another type of disability. Before starting chair exercises, check with your health care provider or a physical therapist to find out how much exercise you can tolerate and which exercises are safe for you. If your health care provider approves:  Start out slowly and build up over time. Aim to work up to about 10-20 minutes for each exercise session.  Make exercise part of your daily routine.  Drink water when you exercise. Do not wait until you are thirsty. Drink every 10-15 minutes.  Stop exercising right away if you have pain, nausea, shortness of breath, or dizziness.  If you are exercising in a wheelchair, make sure to lock the wheels.  Ask your health care provider whether you can do tai chi or yoga. Many positions in these mind-body exercises can be modified to do while seated. Warm-up Before starting other exercises: 1. Sit up as straight as you can. Have your knees bent at 90 degrees, which  is the shape of the capital letter "L." Keep your feet flat on the floor. 2. Sit at the front edge of your chair, if you can. 3. Pull in (tighten) the muscles in your abdomen and stretch your spine and neck as straight as you can. Hold this position for a few minutes. 4. Breathe in and out evenly. Try to concentrate on your breathing, and relax your mind. Stretching Exercise A: Arm stretch 1. Hold your arms out straight in front of your body. 2. Bend your hands at the wrist with your fingers pointing up, as if signaling someone to stop. Notice the slight tension in your forearms as you hold the position. 3. Keeping your arms out and your hands bent, rotate your hands outward as far as you can and hold this stretch. Aim to have your thumbs pointing up and your pinkie fingers pointing down. Slowly repeat arm stretches for one minute as tolerated. Exercise B: Leg stretch 1. If you can move your legs, try to "draw" letters on the floor with the  toes of your foot. Write your name with one foot. 2. Write your name with the toes of your other foot. Slowly repeat the movements for one minute as tolerated. Exercise C: Reach for the sky 1. Reach your hands as far over your head as you can to stretch your spine. 2. Move your hands and arms as if you are climbing a rope. Slowly repeat the movements for one minute as tolerated. Range of motion exercises Exercise A: Shoulder roll 1. Let your arms hang loosely at your sides. 2. Lift just your shoulders up toward your ears, then let them relax back down. 3. When your shoulders feel loose, rotate your shoulders in backward and forward circles. Do shoulder rolls slowly for one minute as tolerated. Exercise B: March in place 1. As if you are marching, pump your arms and lift your legs up and down. Lift your knees as high as you can. ? If you are unable to lift your knees, just pump your arms and move your ankles and feet up and down. March in place for one minute as tolerated. Exercise C: Seated jumping jacks 1. Let your arms hang down straight. 2. Keeping your arms straight, lift them up over your head. Aim to point your fingers to the ceiling. 3. While you lift your arms, straighten your legs and slide your heels along the floor to your sides, as wide as you can. 4. As you bring your arms back down to your sides, slide your legs back together. ? If you are unable to use your legs, just move your arms. Slowly repeat seated jumping jacks for one minute as tolerated. Strengthening exercises Exercise A: Shoulder squeeze 1. Hold your arms straight out from your body to your sides, with your elbows bent and your fists pointed at the ceiling. 2. Keeping your arms in the bent position, move them forward so your elbows and forearms meet in front of your face. 3. Open your arms back out as wide as you can with your elbows still bent, until you feel your shoulder blades squeezing together. Hold for 5  seconds. Slowly repeat the movements forward and backward for one minute as tolerated. Contact a health care provider if you:  Had to stop exercising due to any of the following: ? Pain. ? Nausea. ? Shortness of breath. ? Dizziness. ? Fatigue.  Have significant pain or soreness after exercising. Get help right away if you have:  Chest pain.  Difficulty breathing. These symptoms may represent a serious problem that is an emergency. Do not wait to see if the symptoms will go away. Get medical help right away. Call your local emergency services (911 in the [place] Do not drive yourself to the hospital. This information is not intended to replace advice given to you by your health care provider. Make sure you discuss any questions you have with your health care provider. Document Revised: 11/28/2018 Document Reviewed: 06/20/2017 Elsevier Patient Education  2020 [employer].

## 2019-09-29 NOTE — Progress Notes (Signed)
This visit occurred during the SARS-CoV-2 public health emergency.  Safety protocols were in place, including screening questions prior to the visit, additional usage of staff PPE, and extensive cleaning of exam room while observing appropriate contact time as indicated for disinfecting solutions.  Subjective:     Patient ID: Christina Terry , female    DOB: Jan 07, 1932 , 86 y.o.   MRN: [id number]   Chief Complaint  Patient presents with  . Hypertension    HPI  Hypertension This is a chronic problem. The current episode started more than 1 year ago. The problem has been gradually improving since onset. The problem is controlled. Pertinent negatives include no blurred vision, chest pain, palpitations or shortness of breath. Past treatments include angiotensin blockers and diuretics. The current treatment provides moderate improvement. Compliance problems include exercise.  Hypertensive end-organ damage includes kidney disease.     Past Medical History:  Diagnosis Date  . Acute myocardial infarction, unspecified site, episode of care unspecified   . Unspecified asthma(493.90)   . Unspecified essential hypertension      Family History  Problem Relation Age of Onset  . Emphysema Father   . Alzheimer's disease Father   . Heart disease Maternal Grandmother   . Heart Problems Mother   . Mental illness Mother   . Alzheimer's disease Mother      Current Outpatient Medications:  .  albuterol (PROVENTIL HFA) 108 (90 BASE) MCG/ACT inhaler, Inhale 2 puffs into the lungs every 6 (six) hours as needed for wheezing or shortness of breath (Rescue inahaler)., Disp: 1 Inhaler, Rfl: prn .  DULERA 100-5 MCG/ACT AERO, INHALE 2 PUFFS BY MOUTH TWICE A DAY (MORNING AND EVENING), Disp: 13 Inhaler, Rfl: 5 .  valsartan-hydrochlorothiazide (DIOVAN-HCT) 160-12.5 MG tablet, TAKE 1 TABLET BY MOUTH EVERY DAY, Disp: 90 tablet, Rfl: 0 .  benzonatate (TESSALON) 100 MG capsule, Take 1 capsule (100 mg total) by mouth  every 8 (eight) hours. (Patient not taking: Reported on 09/29/2019), Disp: 21 capsule, Rfl: 0   Allergies  Allergen Reactions  . Aspirin     REACTION: wheeze  . Sulfonamide Derivatives      Review of Systems  Constitutional: Negative.   Eyes: Negative for blurred vision.  Respiratory: Negative.  Negative for shortness of breath.   Cardiovascular: Negative.  Negative for chest pain and palpitations.  Gastrointestinal: Negative.   Neurological: Negative.   Psychiatric/Behavioral: Negative.      Today's Vitals   09/29/19 1132  BP: 124/70  Pulse: 76  Temp: 97.8 F (36.6 C)  TempSrc: Oral  Weight: 180 lb 12.8 oz (82 kg)  Height: 5' 0.6" (1.539 m)  PainSc: 0-No pain   Body mass index is 34.61 kg/m.   Objective:  Physical Exam Vitals and nursing note reviewed.  Constitutional:      Appearance: Normal appearance.  HENT:     Head: Normocephalic and atraumatic.  Cardiovascular:     Rate and Rhythm: Normal rate and regular rhythm.     Heart sounds: Normal heart sounds.  Pulmonary:     Effort: Pulmonary effort is normal.     Breath sounds: Normal breath sounds.  Skin:    General: Skin is warm.  Neurological:     General: No focal deficit present.     Mental Status: She is alert.  Psychiatric:        Mood and Affect: Mood normal.        Behavior: Behavior normal.         Assessment And  Plan:     1. Hypertensive nephropathy  Chronic, well controlled. She will continue with current meds. She is encouraged to avoid adding salt to her foods. She will rto in July 2021 for her next physical/AWV exam.   - CMP14+EGFR  2. Stage 3b chronic kidney disease  Chronic, yet stable. She is encouraged to stay well hydrated.  I will check renal function today.   3. Pure hypercholesterolemia  Chronic, not yet on meds. I will check a fasting lipid panel today. I will make further recommendations once her labs are available for review. She is willing to take meds if needed.   -  Lipid panel  4. Class 1 obesity due to excess calories with serious comorbidity and body mass index (BMI) of 34.0 to 34.9 in adult  Wt Readings from Last 3 Encounters:  09/29/19 180 lb 12.8 oz (82 kg)  05/28/19 179 lb 9.6 oz (81.5 kg)  02/20/19 179 lb 3.2 oz (81.3 kg)   She is encouraged to strive to lose ten pounds to decrease cardiac risk. She is advised to perform chair exercises while watching TV.   [name], MD    THE PATIENT IS ENCOURAGED TO PRACTICE SOCIAL DISTANCING DUE TO THE COVID-19 PANDEMIC.

## 2019-09-30 LAB — CMP14+EGFR
ALT: 11 IU/L (ref 0–32)
AST: 22 IU/L (ref 0–40)
Albumin/Globulin Ratio: 1 — ABNORMAL LOW (ref 1.2–2.2)
Albumin: 3.8 g/dL (ref 3.6–4.6)
Alkaline Phosphatase: 60 IU/L (ref 39–117)
BUN/Creatinine Ratio: 27 (ref 12–28)
BUN: 30 mg/dL — ABNORMAL HIGH (ref 8–27)
Bilirubin Total: 0.4 mg/dL (ref 0.0–1.2)
CO2: 20 mmol/L (ref 20–29)
Calcium: 9.5 mg/dL (ref 8.7–10.3)
Chloride: 105 mmol/L (ref 96–106)
Creatinine, Ser: 1.13 mg/dL — ABNORMAL HIGH (ref 0.57–1.00)
GFR calc Af Amer: 51 mL/min/{1.73_m2} — ABNORMAL LOW (ref >59)
GFR calc non Af Amer: 44 mL/min/{1.73_m2} — ABNORMAL LOW (ref >59)
Globulin, Total: 3.8 g/dL (ref 1.5–4.5)
Glucose: 91 mg/dL (ref 65–99)
Potassium: 4.4 mmol/L (ref 3.5–5.2)
Sodium: 140 mmol/L (ref 134–144)
Total Protein: 7.6 g/dL (ref 6.0–8.5)

## 2019-09-30 LAB — LIPID PANEL
Chol/HDL Ratio: 3.1 ratio (ref 0.0–4.4)
Cholesterol, Total: 229 mg/dL — ABNORMAL HIGH (ref 100–199)
HDL: 73 mg/dL (ref >39)
LDL Chol Calc (NIH): 139 mg/dL — ABNORMAL HIGH (ref 0–99)
Triglycerides: 100 mg/dL (ref 0–149)
VLDL Cholesterol Cal: 17 mg/dL (ref 5–40)

## 2019-10-10 ENCOUNTER — Ambulatory Visit: Payer: Medicare Other

## 2019-10-17 ENCOUNTER — Ambulatory Visit: Payer: Medicare Other | Attending: Internal Medicine

## 2019-10-17 DIAGNOSIS — Z23 Encounter for immunization: Secondary | ICD-10-CM | POA: Insufficient documentation

## 2019-10-17 NOTE — Progress Notes (Signed)
   Covid-19 Vaccination Clinic  Name:  Christina Terry    MRN: [id number] DOB: [birth year]  10/17/2019  [name] was observed post Covid-19 immunization for 15 minutes without incidence. She was provided with Vaccine Information Sheet and instruction to access the V-Safe system.   [name] was instructed to call 911 with any severe reactions post vaccine: Marland Kitchen Difficulty breathing  . Swelling of your face and throat  . A fast heartbeat  . A bad rash all over your body  . Dizziness and weakness    Immunizations Administered    Name Date Dose VIS Date Route   Moderna COVID-19 Vaccine 10/17/2019 10:29 AM 0.5 mL 07/22/2019 Intramuscular   Manufacturer: Moderna   Lot: [id number]   NDC: 09381-829-93

## 2020-02-26 ENCOUNTER — Ambulatory Visit: Payer: Medicare Other

## 2020-02-26 ENCOUNTER — Encounter: Payer: Medicare Other | Admitting: Internal Medicine

## 2020-03-04 ENCOUNTER — Ambulatory Visit: Payer: Self-pay | Admitting: Internal Medicine

## 2020-03-04 ENCOUNTER — Ambulatory Visit (INDEPENDENT_AMBULATORY_CARE_PROVIDER_SITE_OTHER): Payer: Medicare Other

## 2020-03-04 ENCOUNTER — Ambulatory Visit (INDEPENDENT_AMBULATORY_CARE_PROVIDER_SITE_OTHER): Payer: Medicare Other | Admitting: Internal Medicine

## 2020-03-04 ENCOUNTER — Encounter: Payer: Self-pay | Admitting: Internal Medicine

## 2020-03-04 ENCOUNTER — Other Ambulatory Visit: Payer: Self-pay

## 2020-03-04 VITALS — BP 130/70 | HR 78 | Temp 97.8°F | Ht 60.6 in | Wt 182.0 lb

## 2020-03-04 DIAGNOSIS — E6609 Other obesity due to excess calories: Secondary | ICD-10-CM | POA: Diagnosis not present

## 2020-03-04 DIAGNOSIS — E559 Vitamin D deficiency, unspecified: Secondary | ICD-10-CM

## 2020-03-04 DIAGNOSIS — N1832 Chronic kidney disease, stage 3b: Secondary | ICD-10-CM | POA: Insufficient documentation

## 2020-03-04 DIAGNOSIS — Z0000 Encounter for general adult medical examination without abnormal findings: Secondary | ICD-10-CM

## 2020-03-04 DIAGNOSIS — I129 Hypertensive chronic kidney disease with stage 1 through stage 4 chronic kidney disease, or unspecified chronic kidney disease: Secondary | ICD-10-CM

## 2020-03-04 DIAGNOSIS — I10 Essential (primary) hypertension: Secondary | ICD-10-CM | POA: Diagnosis not present

## 2020-03-04 DIAGNOSIS — Z6833 Body mass index (BMI) 33.0-33.9, adult: Secondary | ICD-10-CM | POA: Insufficient documentation

## 2020-03-04 DIAGNOSIS — Z6834 Body mass index (BMI) 34.0-34.9, adult: Secondary | ICD-10-CM

## 2020-03-04 LAB — POCT URINALYSIS DIPSTICK
Bilirubin, UA: NEGATIVE
Blood, UA: NEGATIVE
Glucose, UA: NEGATIVE
Ketones, UA: NEGATIVE
Leukocytes, UA: NEGATIVE
Nitrite, UA: NEGATIVE
Protein, UA: NEGATIVE
Spec Grav, UA: <=1.005 — AB (ref 1.010–1.025)
Urobilinogen, UA: 0.2 E.U./dL
pH, UA: 5.5 (ref 5.0–8.0)

## 2020-03-04 LAB — POCT UA - MICROALBUMIN
Albumin/Creatinine Ratio, Urine, POC: <30
Creatinine, POC: 100 mg/dL
Microalbumin Ur, POC: 10 mg/L

## 2020-03-04 NOTE — Patient Instructions (Signed)

## 2020-03-04 NOTE — Progress Notes (Signed)
This visit occurred during the SARS-CoV-2 public health emergency.  Safety protocols were in place, including screening questions prior to the visit, additional usage of staff PPE, and extensive cleaning of exam room while observing appropriate contact time as indicated for disinfecting solutions.  Subjective:   Christina Terry is a 86 y.o. female who presents for Medicare Annual (Subsequent) preventive examination.  Review of Systems     Cardiac Risk Factors include: advanced age (>55men, >74 women);hypertension;obesity (BMI >30kg/m2);sedentary lifestyle     Objective:    Today's Vitals   03/04/20 1413 03/04/20 1442  BP: (!) 150/78 130/70  Pulse: 78   Temp: 97.8 F (36.6 C)   TempSrc: Oral   SpO2: 98%   Weight: 182 lb (82.6 kg)   Height: 5' 0.6" (1.539 m)    Body mass index is 34.84 kg/m.  Advanced Directives 03/04/2020 02/20/2019  Does Patient Have a Medical Advance Directive? No Yes  Type of Advance Directive - Healthcare Power of [place];Living will  Copy of Healthcare Power of Attorney in Chart? - No - copy requested  Would patient like information on creating a medical advance directive? No - Patient declined -    Current Medications (verified) Outpatient Encounter Medications as of 03/04/2020  Medication Sig  . mometasone-formoterol (DULERA) 100-5 MCG/ACT AERO Inhale 2 puffs into the lungs 2 (two) times daily.  . valsartan-hydrochlorothiazide (DIOVAN-HCT) 160-12.5 MG tablet Take 1 tablet by mouth daily.  Marland Kitchen albuterol (PROVENTIL HFA) 108 (90 BASE) MCG/ACT inhaler Inhale 2 puffs into the lungs every 6 (six) hours as needed for wheezing or shortness of breath ([facility] inahaler).  . benzonatate (TESSALON) 100 MG capsule Take 1 capsule (100 mg total) by mouth every 8 (eight) hours. (Patient not taking: Reported on 09/29/2019)   No facility-administered encounter medications on file as of 03/04/2020.    Allergies (verified) Aspirin and Sulfonamide derivatives   History: Past  Medical History:  Diagnosis Date  . Acute myocardial infarction, unspecified site, episode of care unspecified   . Unspecified asthma(493.90)   . Unspecified essential hypertension    Past Surgical History:  Procedure Laterality Date  . sinus surgey    . TOTAL ABDOMINAL HYSTERECTOMY W/ BILATERAL SALPINGOOPHORECTOMY     Family History  Problem Relation Age of Onset  . Emphysema Father   . Alzheimer's disease Father   . Heart disease Maternal Grandmother   . Heart Problems Mother   . Mental illness Mother   . Alzheimer's disease Mother    Social History   Socioeconomic History  . Marital status: Widowed    Spouse name: Not on file  . Number of children: Not on file  . Years of education: Not on file  . Highest education level: Not on file  Occupational History  . Occupation: Cook  Tobacco Use  . Smoking status: Never Smoker  . Smokeless tobacco: Never Used  Vaping Use  . Vaping Use: Never used  Substance and Sexual Activity  . Alcohol use: No  . Drug use: No  . Sexual activity: Not Currently  Other Topics Concern  . Not on file  Social History Narrative  . Not on file   Social Determinants of Health   Financial Resource Strain: Low Risk   . Difficulty of Paying Living Expenses: Not hard at all  Food Insecurity: No Food Insecurity  . Worried About Programme researcher, broadcasting/film/video in the Last Year: Never true  . Ran Out of Food in the Last Year: Never true  Transportation Needs: No  Transportation Needs  . Lack of Transportation (Medical): No  . Lack of Transportation (Non-Medical): No  Physical Activity: Inactive  . Days of Exercise per Week: 0 days  . Minutes of Exercise per Session: 0 min  Stress: No Stress Concern Present  . Feeling of Stress : Only a little  Social Connections:   . Frequency of Communication with Friends and Family:   . Frequency of Social Gatherings with Friends and Family:   . Attends Religious Services:   . Active Member of Clubs or Organizations:    . Attends Banker Meetings:   Marland Kitchen Marital Status:     Tobacco Counseling Counseling given: Not Answered   Clinical Intake:  Pre-visit preparation completed: Yes  Pain : No/denies pain     Nutritional Status: BMI > 30  Obese Nutritional Risks: None Diabetes: No  How often do you need to have someone help you when you read instructions, pamphlets, or other written materials from your doctor or pharmacy?: 1 - Never What is the last grade level you completed in school?: 12th grade  Diabetic? no  Interpreter Needed?: No  Information entered by :: NAllen LPN   Activities of Daily Living In your present state of health, do you have any difficulty performing the following activities: 03/04/2020  Hearing? Y  Comment working on getting a hearing aide  Vision? N  Difficulty concentrating or making decisions? N  Walking or climbing stairs? N  Dressing or bathing? N  Doing errands, shopping? N  Preparing Food and eating ? N  Using the Toilet? N  In the past six months, have you accidently leaked urine? N  Do you have problems with loss of bowel control? N  Managing your Medications? N  Managing your Finances? N  Housekeeping or managing your Housekeeping? N  Some recent data might be hidden    Patient Care Team: Dorothyann Peng, MD as PCP - General (Internal Medicine)  Indicate any recent Medical Services you may have received from other than [facility] providers in the past year (date may be approximate).     Assessment:   This is a routine wellness examination for [place].  Hearing/Vision screen  Hearing Screening   125Hz  250Hz  500Hz  1000Hz  2000Hz  3000Hz  4000Hz  6000Hz  8000Hz   Right ear:           Left ear:           Vision Screening Comments: No regular eye exams, Dr.  Dietary issues and exercise activities discussed: Current Exercise Habits: The patient does not participate in regular exercise at present  Goals    . DIET - INCREASE WATER INTAKE     . Patient Stated     03/04/2020, wants to exercise more, eat healthier, drink more water      Depression Screen PHQ 2/9 Scores 03/04/2020 05/28/2019 02/20/2019 02/20/2019  PHQ - 2 Score 0 0 0 0  PHQ- 9 Score 1 - - -    Fall Risk Fall Risk  03/04/2020 05/28/2019 02/20/2019 02/20/2019  Falls in the past year? 0 0 0 0  Risk for fall due to : Medication side effect - Medication side effect -  Follow up Falls evaluation completed;Education provided;Falls prevention discussed - Falls evaluation completed;Education provided;Falls prevention discussed -    Any stairs in or around the home? Yes  If so, are there any without handrails? Yes  Home free of loose throw rugs in walkways, pet beds, electrical cords, etc? Yes  Adequate lighting in your home  to reduce risk of falls? Yes   ASSISTIVE DEVICES UTILIZED TO PREVENT FALLS:  Life alert? No  Use of a cane, walker or w/c? No  Grab bars in the bathroom? No  Shower chair or bench in shower? No  Elevated toilet seat or a handicapped toilet? Yes   TIMED UP AND GO:  Was the test performed? No .   Gait slow and steady without use of assistive device  Cognitive Function:     6CIT Screen 03/04/2020 02/20/2019  What Year? 0 points 0 points  What month? 0 points 0 points  What time? 0 points 3 points  Count back from 20 0 points 0 points  Months in reverse 2 points 2 points  Repeat phrase 4 points 0 points  Total Score 6 5    Immunizations Immunization History  Administered Date(s) Administered  . Fluad Quad(high Dose 65+) 05/06/2019  . Influenza Split 08/02/2011  . Influenza Whole 05/19/2009, 05/21/2010  . Influenza, High Dose Seasonal PF 06/04/2018  . Influenza-Unspecified 05/06/2019  . Moderna SARS-COVID-2 Vaccination 09/11/2019, 10/17/2019  . Pneumococcal Conjugate-13 06/26/2018  . Pneumococcal Polysaccharide-23 05/19/2009    TDAP status: Up to date Flu Vaccine status: Up to date Pneumococcal vaccine status: Up to date Covid-19  vaccine status: Completed vaccines  Qualifies for Shingles Vaccine? Yes   Zostavax completed No   Shingrix Completed?: Yes  Screening Tests Health Maintenance  Topic Date Due  . INFLUENZA VACCINE  03/21/2020  . TETANUS/TDAP  09/28/2021  . DEXA SCAN  Completed  . COVID-19 Vaccine  Completed  . PNA vac Low Risk Adult  Completed    Health Maintenance  There are no preventive care reminders to display for this patient.  Colorectal cancer screening: No longer required.  Mammogram status: No longer required.  Bone Density status: Completed 12/08/2011.  Lung Cancer Screening: (Low Dose CT Chest recommended if Age 32-80 years, 30 pack-year currently smoking OR have quit w/in 15years.) does not qualify.   Lung Cancer Screening Referral: no  Additional Screening:  Hepatitis C Screening: does not qualify;   Vision Screening: Recommended annual ophthalmology exams for early detection of glaucoma and other disorders of the eye. Is the patient up to date with their annual eye exam?  No  Who is the provider or what is the name of the office in which the patient attends annual eye exams? Dr. Dione Booze If pt is not established with a provider, would they like to be referred to a provider to establish care? No .   Dental Screening: Recommended annual dental exams for proper oral hygiene  Community Resource Referral / Chronic Care Management: CRR required this visit?  No   CCM required this visit?  No      Plan:     I have personally reviewed and noted the following in the patient's chart:   . Medical and social history . Use of alcohol, tobacco or illicit drugs  . Current medications and supplements . Functional ability and status . Nutritional status . Physical activity . Advanced directives . List of other physicians . Hospitalizations, surgeries, and ER visits in previous 12 months . Vitals . Screenings to include cognitive, depression, and falls . Referrals and  appointments  In addition, I have reviewed and discussed with patient certain preventive protocols, quality metrics, and best practice recommendations. A written personalized care plan for preventive services as well as general preventive health recommendations were provided to patient.     Barb Merino, LPN  03/04/2020   Nurse Notes:

## 2020-03-04 NOTE — Patient Instructions (Signed)
[name] , Thank you for taking time to come for your Medicare Wellness Visit. I appreciate your ongoing commitment to your health goals. Please review the following plan we discussed and let me know if I can assist you in the future.   Screening recommendations/referrals: Colonoscopy: not required Mammogram: not required Bone Density: completed 12/08/2011 Recommended yearly ophthalmology/optometry visit for glaucoma screening and checkup Recommended yearly dental visit for hygiene and checkup  Vaccinations: Influenza vaccine: completed 05/06/2019, due 03/21/2020 Pneumococcal vaccine: completed 06/26/2018 Tdap vaccine: completed 09/29/2011, due 09/28/2021 Shingles vaccine: discussed   Covid-19:10/17/2019, 09/11/2019  Advanced directives: Advance directive discussed with you today. Even though you declined this today please call our office should you change your mind and we can give you the proper paperwork for you to fill out.  Conditions/risks identified: none  Next appointment: 07/06/2020 at 2:00 Follow up in one year for your annual wellness visit    Preventive Care 65 Years and Older, Female Preventive care refers to lifestyle choices and visits with your health care provider that can promote health and wellness. What does preventive care include?  A yearly physical exam. This is also called an annual well check.  Dental exams once or twice a year.  Routine eye exams. Ask your health care provider how often you should have your eyes checked.  Personal lifestyle choices, including:  Daily care of your teeth and gums.  Regular physical activity.  Eating a healthy diet.  Avoiding tobacco and drug use.  Limiting alcohol use.  Practicing safe sex.  Taking low-dose aspirin every day.  Taking vitamin and mineral supplements as recommended by your health care provider. What happens during an annual well check? The services and screenings done by your health care provider  during your annual well check will depend on your age, overall health, lifestyle risk factors, and family history of disease. Counseling  Your health care provider may ask you questions about your:  Alcohol use.  Tobacco use.  Drug use.  Emotional well-being.  Home and relationship well-being.  Sexual activity.  Eating habits.  History of falls.  Memory and ability to understand (cognition).  Work and work Astronomer.  Reproductive health. Screening  You may have the following tests or measurements:  Height, weight, and BMI.  Blood pressure.  Lipid and cholesterol levels. These may be checked every 5 years, or more frequently if you are over 58 years old.  Skin check.  Lung cancer screening. You may have this screening every year starting at age 16 if you have a 30-pack-year history of smoking and currently smoke or have quit within the past 15 years.  Fecal occult blood test (FOBT) of the stool. You may have this test every year starting at age 83.  Flexible sigmoidoscopy or colonoscopy. You may have a sigmoidoscopy every 5 years or a colonoscopy every 10 years starting at age 50.  Hepatitis C blood test.  Hepatitis B blood test.  Sexually transmitted disease (STD) testing.  Diabetes screening. This is done by checking your blood sugar (glucose) after you have not eaten for a while (fasting). You may have this done every 1-3 years.  Bone density scan. This is done to screen for osteoporosis. You may have this done starting at age 46.  Mammogram. This may be done every 1-2 years. Talk to your health care provider about how often you should have regular mammograms. Talk with your health care provider about your test results, treatment options, and if necessary, the need  for more tests. Vaccines  Your health care provider may recommend certain vaccines, such as:  Influenza vaccine. This is recommended every year.  Tetanus, diphtheria, and acellular pertussis  (Tdap, Td) vaccine. You may need a Td booster every 10 years.  Zoster vaccine. You may need this after age 34.  Pneumococcal 13-valent conjugate (PCV13) vaccine. One dose is recommended after age 76.  Pneumococcal polysaccharide (PPSV23) vaccine. One dose is recommended after age 52. Talk to your health care provider about which screenings and vaccines you need and how often you need them. This information is not intended to replace advice given to you by your health care provider. Make sure you discuss any questions you have with your health care provider. Document Released: 09/03/2015 Document Revised: 04/26/2016 Document Reviewed: 06/08/2015 Elsevier Interactive Patient Education  2017 [employer] Prevention in the Home Falls can cause injuries. They can happen to people of all ages. There are many things you can do to make your home safe and to help prevent falls. What can I do on the outside of my home?  Regularly fix the edges of walkways and driveways and fix any cracks.  Remove anything that might make you trip as you walk through a door, such as a raised step or threshold.  Trim any bushes or trees on the path to your home.  Use bright outdoor lighting.  Clear any walking paths of anything that might make someone trip, such as rocks or tools.  Regularly check to see if handrails are loose or broken. Make sure that both sides of any steps have handrails.  Any raised decks and porches should have guardrails on the edges.  Have any leaves, snow, or ice cleared regularly.  Use sand or salt on walking paths during winter.  Clean up any spills in your garage right away. This includes oil or grease spills. What can I do in the bathroom?  Use night lights.  Install grab bars by the toilet and in the tub and shower. Do not use towel bars as grab bars.  Use non-skid mats or decals in the tub or shower.  If you need to sit down in the shower, use a plastic, non-slip  stool.  Keep the floor dry. Clean up any water that spills on the floor as soon as it happens.  Remove soap buildup in the tub or shower regularly.  Attach bath mats securely with double-sided non-slip rug tape.  Do not have throw rugs and other things on the floor that can make you trip. What can I do in the bedroom?  Use night lights.  Make sure that you have a light by your bed that is easy to reach.  Do not use any sheets or blankets that are too big for your bed. They should not hang down onto the floor.  Have a firm chair that has side arms. You can use this for support while you get dressed.  Do not have throw rugs and other things on the floor that can make you trip. What can I do in the kitchen?  Clean up any spills right away.  Avoid walking on wet floors.  Keep items that you use a lot in easy-to-reach places.  If you need to reach something above you, use a strong step stool that has a grab bar.  Keep electrical cords out of the way.  Do not use floor polish or wax that makes floors slippery. If you must use wax, use  non-skid floor wax.  Do not have throw rugs and other things on the floor that can make you trip. What can I do with my stairs?  Do not leave any items on the stairs.  Make sure that there are handrails on both sides of the stairs and use them. Fix handrails that are broken or loose. Make sure that handrails are as long as the stairways.  Check any carpeting to make sure that it is firmly attached to the stairs. Fix any carpet that is loose or worn.  Avoid having throw rugs at the top or bottom of the stairs. If you do have throw rugs, attach them to the floor with carpet tape.  Make sure that you have a light switch at the top of the stairs and the bottom of the stairs. If you do not have them, ask someone to add them for you. What else can I do to help prevent falls?  Wear shoes that:  Do not have high heels.  Have rubber bottoms.  Are  comfortable and fit you well.  Are closed at the toe. Do not wear sandals.  If you use a stepladder:  Make sure that it is fully opened. Do not climb a closed stepladder.  Make sure that both sides of the stepladder are locked into place.  Ask someone to hold it for you, if possible.  Clearly mark and make sure that you can see:  Any grab bars or handrails.  First and last steps.  Where the edge of each step is.  Use tools that help you move around (mobility aids) if they are needed. These include:  Canes.  Walkers.  Scooters.  Crutches.  Turn on the lights when you go into a dark area. Replace any light bulbs as soon as they burn out.  Set up your furniture so you have a clear path. Avoid moving your furniture around.  If any of your floors are uneven, fix them.  If there are any pets around you, be aware of where they are.  Review your medicines with your doctor. Some medicines can make you feel dizzy. This can increase your chance of falling. Ask your doctor what other things that you can do to help prevent falls. This information is not intended to replace advice given to you by your health care provider. Make sure you discuss any questions you have with your health care provider. Document Released: 06/03/2009 Document Revised: 01/13/2016 Document Reviewed: 09/11/2014 Elsevier Interactive Patient Education  2017 [employer].

## 2020-03-04 NOTE — Progress Notes (Signed)
I,[name],acting as a Education administrator for [name], MD.,have documented all relevant documentation on the behalf of [name], MD,as directed by  [name], MD while in the presence of [name], MD.  This visit occurred during the SARS-CoV-2 public health emergency.  Safety protocols were in place, including screening questions prior to the visit, additional usage of staff PPE, and extensive cleaning of exam room while observing appropriate contact time as indicated for disinfecting solutions.  Subjective:     Patient ID: Christina Terry , female    DOB: 02-27-1932 , 86 y.o.   MRN: [id number]   Chief Complaint  Patient presents with  . Hypertension    HPI  Patient presents today for a hypertension check up. Patient has no complaints at this time.   Hypertension This is a chronic problem. The current episode started more than 1 year ago. The problem has been gradually improving since onset. The problem is controlled. Pertinent negatives include no blurred vision, chest pain, palpitations or shortness of breath. Risk factors for coronary artery disease include obesity, post-menopausal state and sedentary lifestyle. Past treatments include angiotensin blockers. The current treatment provides moderate improvement. Compliance problems include exercise.      Past Medical History:  Diagnosis Date  . Acute myocardial infarction, unspecified site, episode of care unspecified   . Unspecified asthma(493.90)   . Unspecified essential hypertension      Family History  Problem Relation Age of Onset  . Emphysema Father   . Alzheimer's disease Father   . Heart disease Maternal Grandmother   . Heart Problems Mother   . Mental illness Mother   . Alzheimer's disease Mother      Current Outpatient Medications:  .  albuterol (PROVENTIL HFA) 108 (90 BASE) MCG/ACT inhaler, Inhale 2 puffs into the lungs every 6 (six) hours as needed for wheezing or shortness of breath (Rescue  inahaler)., Disp: 1 Inhaler, Rfl: prn .  benzonatate (TESSALON) 100 MG capsule, Take 1 capsule (100 mg total) by mouth every 8 (eight) hours. (Patient not taking: Reported on 09/29/2019), Disp: 21 capsule, Rfl: 0 .  mometasone-formoterol (DULERA) 100-5 MCG/ACT AERO, Inhale 2 puffs into the lungs 2 (two) times daily., Disp: 13 g, Rfl: 6 .  valsartan-hydrochlorothiazide (DIOVAN-HCT) 160-12.5 MG tablet, Take 1 tablet by mouth daily., Disp: 90 tablet, Rfl: 2   Allergies  Allergen Reactions  . Aspirin     REACTION: wheeze  . Sulfonamide Derivatives      Review of Systems  Constitutional: Negative.   Eyes: Negative for blurred vision.  Respiratory: Negative.  Negative for shortness of breath.   Cardiovascular: Negative.  Negative for chest pain and palpitations.  Gastrointestinal: Negative.   Neurological: Negative.      Today's Vitals   03/04/20 1427  BP: 130/70  Pulse: 78  Temp: 97.8 F (36.6 C)  TempSrc: Oral  Weight: 182 lb (82.6 kg)  Height: 5' 0.6" (1.539 m)  PainSc: 0-No pain   Body mass index is 34.84 kg/m.   Objective:  Physical Exam Vitals and nursing note reviewed.  Constitutional:      Appearance: Normal appearance.  HENT:     Head: Normocephalic and atraumatic.  Cardiovascular:     Rate and Rhythm: Normal rate and regular rhythm.     Heart sounds: Normal heart sounds.  Pulmonary:     Effort: Pulmonary effort is normal.     Breath sounds: Normal breath sounds.  Abdominal:     General: Bowel sounds  are normal.     Palpations: Abdomen is soft.  Neurological:     General: No focal deficit present.     Mental Status: She is alert and oriented to person, place, and time.  Psychiatric:        Mood and Affect: Mood normal.        Behavior: Behavior normal.         Assessment And Plan:     1. Hypertensive nephropathy  ADVISED TO AVOID PROCESSED, PACKAGED AND CANNED FOODS WHICH TEND TO BE HIGH IN SODIUM.  ALSO ADVISED TO INCREASE DAILY ACTIVITY AND TO  MAINTAIN AN OPTIMAL BODY COMPOSITION.  - BMP8+EGFR - TSH  2. Stage 3b chronic kidney disease  Chronic, yet stable, patient is aware that adequate hydration and optimal blood pressure control is necessary to prevent further progression of CKD.    - Protein electrophoresis, serum - Phosphorus - PTH, intact and calcium  3. Class 1 obesity due to excess calories with serious comorbidity and body mass index (BMI) of 34.0 to 34.9 in adult  She  is encouraged to initially strive for BMI less than 30 to decrease cardiac risk. She is advised to exercise no less than 150 minutes per week.   4. Mild vitamin D deficiency  I WILL CHECK A VIT D LEVEL AND SUPPLEMENT AS NEEDED.  ALSO ENCOURAGED TO SPEND 15 MINUTES IN THE SUN DAILY.  - Vitamin D (25 hydroxy)     Patient was given opportunity to ask questions. Patient verbalized understanding of the plan and was able to repeat key elements of the plan. All questions were answered to their satisfaction.  [name], MD   I, [name], MD, have reviewed all documentation for this visit. The documentation on 03/04/20 for the exam, diagnosis, procedures, and orders are all accurate and complete.  THE PATIENT IS ENCOURAGED TO PRACTICE SOCIAL DISTANCING DUE TO THE COVID-19 PANDEMIC.

## 2020-03-05 LAB — TSH: TSH: 0.424 u[IU]/mL — ABNORMAL LOW (ref 0.450–4.500)

## 2020-03-08 LAB — PROTEIN ELECTROPHORESIS, SERUM
A/G Ratio: 0.8 (ref 0.7–1.7)
Albumin ELP: 3.4 g/dL (ref 2.9–4.4)
Alpha 1: 0.2 g/dL (ref 0.0–0.4)
Alpha 2: 0.7 g/dL (ref 0.4–1.0)
Beta: 1.1 g/dL (ref 0.7–1.3)
Gamma Globulin: 2.1 g/dL — ABNORMAL HIGH (ref 0.4–1.8)
Globulin, Total: 4.1 g/dL — ABNORMAL HIGH (ref 2.2–3.9)
Total Protein: 7.5 g/dL (ref 6.0–8.5)

## 2020-03-08 LAB — BMP8+EGFR
BUN/Creatinine Ratio: 24 (ref 12–28)
BUN: 30 mg/dL — ABNORMAL HIGH (ref 8–27)
CO2: 20 mmol/L (ref 20–29)
Calcium: 9.2 mg/dL (ref 8.7–10.3)
Chloride: 104 mmol/L (ref 96–106)
Creatinine, Ser: 1.24 mg/dL — ABNORMAL HIGH (ref 0.57–1.00)
GFR calc Af Amer: 45 mL/min/{1.73_m2} — ABNORMAL LOW (ref >59)
GFR calc non Af Amer: 39 mL/min/{1.73_m2} — ABNORMAL LOW (ref >59)
Glucose: 87 mg/dL (ref 65–99)
Potassium: 4.1 mmol/L (ref 3.5–5.2)
Sodium: 137 mmol/L (ref 134–144)

## 2020-03-08 LAB — PTH, INTACT AND CALCIUM: PTH: 47 pg/mL (ref 15–65)

## 2020-03-08 LAB — VITAMIN D 25 HYDROXY (VIT D DEFICIENCY, FRACTURES): Vit D, 25-Hydroxy: 32 ng/mL (ref 30.0–100.0)

## 2020-03-08 LAB — PHOSPHORUS: Phosphorus: 3.1 mg/dL (ref 3.0–4.3)

## 2020-03-12 ENCOUNTER — Other Ambulatory Visit: Payer: Self-pay

## 2020-07-06 ENCOUNTER — Other Ambulatory Visit: Payer: Self-pay

## 2020-07-06 ENCOUNTER — Encounter: Payer: Self-pay | Admitting: Internal Medicine

## 2020-07-06 ENCOUNTER — Ambulatory Visit (INDEPENDENT_AMBULATORY_CARE_PROVIDER_SITE_OTHER): Payer: Medicare Other | Admitting: Internal Medicine

## 2020-07-06 VITALS — BP 148/70 | HR 83 | Temp 97.8°F | Ht 60.8 in | Wt 179.2 lb

## 2020-07-06 DIAGNOSIS — J4520 Mild intermittent asthma, uncomplicated: Secondary | ICD-10-CM

## 2020-07-06 DIAGNOSIS — R0981 Nasal congestion: Secondary | ICD-10-CM | POA: Diagnosis not present

## 2020-07-06 DIAGNOSIS — N1832 Chronic kidney disease, stage 3b: Secondary | ICD-10-CM | POA: Diagnosis not present

## 2020-07-06 DIAGNOSIS — I129 Hypertensive chronic kidney disease with stage 1 through stage 4 chronic kidney disease, or unspecified chronic kidney disease: Secondary | ICD-10-CM | POA: Diagnosis not present

## 2020-07-06 DIAGNOSIS — Z6834 Body mass index (BMI) 34.0-34.9, adult: Secondary | ICD-10-CM

## 2020-07-06 DIAGNOSIS — E6609 Other obesity due to excess calories: Secondary | ICD-10-CM

## 2020-07-06 MED ORDER — TRIAMCINOLONE ACETONIDE 40 MG/ML IJ SUSP
40.0000 mg | Freq: Once | INTRAMUSCULAR | Status: AC
  Administered 2020-07-06: 40 mg via INTRAMUSCULAR

## 2020-07-06 NOTE — Progress Notes (Signed)
This visit occurred during the SARS-CoV-2 public health emergency.  Safety protocols were in place, including screening questions prior to the visit, additional usage of staff PPE, and extensive cleaning of exam room while observing appropriate contact time as indicated for disinfecting solutions.  Subjective:     Patient ID: Christina Terry , female    DOB: Dec 26, 1931 , 86 y.o.   MRN: [id number]   Chief Complaint  Patient presents with  . Hypertension  . Sinus Problem    HPI  She presents today for physical exam. However, she reports she is too upset to do this today. She states one of her friends passed last night. She is "not in mood" for this today. I will reschedule physical exam w/ Raman, DNP next month.   Hypertension This is a chronic problem. The current episode started more than 1 year ago. The problem has been gradually improving since onset. The problem is controlled. Pertinent negatives include no blurred vision, chest pain, palpitations or shortness of breath. Past treatments include angiotensin blockers and diuretics. The current treatment provides moderate improvement. Compliance problems include exercise.  Hypertensive end-organ damage includes kidney disease.  Sinus Problem Associated symptoms include congestion. Pertinent negatives include no shortness of breath.     Past Medical History:  Diagnosis Date  . Acute myocardial infarction, unspecified site, episode of care unspecified   . Unspecified asthma(493.90)   . Unspecified essential hypertension      Family History  Problem Relation Age of Onset  . Emphysema Father   . Alzheimer's disease Father   . Heart disease Maternal Grandmother   . Heart Problems Mother   . Mental illness Mother   . Alzheimer's disease Mother      Current Outpatient Medications:  .  albuterol (PROVENTIL HFA) 108 (90 BASE) MCG/ACT inhaler, Inhale 2 puffs into the lungs every 6 (six) hours as needed for wheezing or shortness of  breath (Rescue inahaler)., Disp: 1 Inhaler, Rfl: prn .  valsartan-hydrochlorothiazide (DIOVAN-HCT) 160-12.5 MG tablet, Take 1 tablet by mouth daily., Disp: 90 tablet, Rfl: 2 .  DULERA 100-5 MCG/ACT AERO, TAKE 2 PUFFS BY MOUTH TWICE A DAY, Disp: 13 each, Rfl: 6   Allergies  Allergen Reactions  . Aspirin     REACTION: wheeze  . Sulfonamide Derivatives      Review of Systems  Constitutional: Negative.   HENT: Positive for congestion.   Eyes: Negative for blurred vision.  Respiratory: Negative.  Negative for shortness of breath.   Cardiovascular: Negative.  Negative for chest pain and palpitations.  Gastrointestinal: Negative.   Neurological: Negative.   Psychiatric/Behavioral: Negative.      Today's Vitals   07/06/20 1405  BP: (!) 148/70  Pulse: 83  Temp: 97.8 F (36.6 C)  TempSrc: Oral  Weight: 179 lb 3.2 oz (81.3 kg)  Height: 5' 0.8" (1.544 m)   Body mass index is 34.08 kg/m.   Objective:  Physical Exam Vitals and nursing note reviewed.  Constitutional:      Appearance: Normal appearance. She is obese.  HENT:     Head: Normocephalic and atraumatic.  Cardiovascular:     Rate and Rhythm: Normal rate and regular rhythm.     Heart sounds: Normal heart sounds.  Pulmonary:     Effort: Pulmonary effort is normal.     Breath sounds: Normal breath sounds.     Comments: Decreased breath sounds Skin:    General: Skin is warm.  Neurological:     General: No focal deficit present.  Mental Status: She is alert.  Psychiatric:        Mood and Affect: Mood normal.        Behavior: Behavior normal.         Assessment And Plan:     1. Hypertensive nephropathy Comments: Chronic, fair contrl. She will continue with current meds. She is encouraged to avoid adding salt to her foods. I will check renal function today.  - CMP14+EGFR  2. Stage 3b chronic kidney disease ([place]) Comments: Chronic. Importance of adequate hydration and optimal BP control to prevent CKD  progression was discussed with the patient.   3. Sinus congestion Comments: She was given Kenalog, 39m IM x 1. Encouraged to avoid dairy products. Rapid COVID and flu a/b tests are negative.  - POC Influenza A&B (Binax test) - POC COVID-19 - triamcinolone acetonide (KENALOG-40) injection 40 mg - CBC  4. Mild intermittent asthma without complication Comments: Chronic, yet stable.   5. Class 1 obesity due to excess calories with serious comorbidity and body mass index (BMI) of 34.0 to 34.9 in adult Comments: She is encouraged to strive for BMI less than 30 to decrease cardiac risk. Advised to graudally increase her daily activity.      Patient was given opportunity to ask questions. Patient verbalized understanding of the plan and was able to repeat key elements of the plan. All questions were answered to their satisfaction.  RMaximino Greenland MD   I, RMaximino Greenland MD, have reviewed all documentation for this visit. The documentation on 07/19/20 for the exam, diagnosis, procedures, and orders are all accurate and complete.  THE PATIENT IS ENCOURAGED TO PRACTICE SOCIAL DISTANCING DUE TO THE COVID-19 PANDEMIC.

## 2020-07-06 NOTE — Progress Notes (Deleted)
I,Christina Terry,acting as a Neurosurgeon for Christina Aliment, MD.,have documented all relevant documentation on the behalf of Christina Aliment, MD,as directed by  Christina Aliment, MD while in the presence of Christina Aliment, MD.  This visit occurred during the SARS-CoV-2 public health emergency.  Safety protocols were in place, including screening questions prior to the visit, additional usage of staff PPE, and extensive cleaning of exam room while observing appropriate contact time as indicated for disinfecting solutions.  Subjective:     Patient ID: Christina Terry , female    DOB: [birth year] , 86 y.o.   MRN: [id number]   Chief Complaint  Patient presents with  . Annual Exam    HPI  The patient is here today for a physical examination.     Past Medical History:  Diagnosis Date  . Acute myocardial infarction, unspecified site, episode of care unspecified   . Unspecified asthma(493.90)   . Unspecified essential hypertension      Family History  Problem Relation Age of Onset  . Emphysema Father   . Alzheimer's disease Father   . Heart disease Maternal Grandmother   . Heart Problems Mother   . Mental illness Mother   . Alzheimer's disease Mother      Current Outpatient Medications:  .  albuterol (PROVENTIL HFA) 108 (90 BASE) MCG/ACT inhaler, Inhale 2 puffs into the lungs every 6 (six) hours as needed for wheezing or shortness of breath (Rescue inahaler)., Disp: 1 Inhaler, Rfl: prn .  benzonatate (TESSALON) 100 MG capsule, Take 1 capsule (100 mg total) by mouth every 8 (eight) hours. (Patient not taking: Reported on 09/29/2019), Disp: 21 capsule, Rfl: 0 .  mometasone-formoterol (DULERA) 100-5 MCG/ACT AERO, Inhale 2 puffs into the lungs 2 (two) times daily., Disp: 13 g, Rfl: 6 .  valsartan-hydrochlorothiazide (DIOVAN-HCT) 160-12.5 MG tablet, Take 1 tablet by mouth daily., Disp: 90 tablet, Rfl: 2   Allergies  Allergen Reactions  . Aspirin     REACTION: wheeze  . Sulfonamide  Derivatives       The patient states she uses {contraceptive methods:5051} for birth control. Last LMP was No LMP recorded. Patient is postmenopausal.. {Dysmenorrhea-menorrhagia:21918}. Negative for: breast discharge, breast lump(s), breast pain and breast self exam. Associated symptoms include abnormal vaginal bleeding. Pertinent negatives include abnormal bleeding (hematology), anxiety, decreased libido, depression, difficulty falling sleep, dyspareunia, history of infertility, nocturia, sexual dysfunction, sleep disturbances, urinary incontinence, urinary urgency, vaginal discharge and vaginal itching. Diet regular.The patient states her exercise level is    . The patient's tobacco use is:  Social History   Tobacco Use  Smoking Status Never Smoker  Smokeless Tobacco Never Used  . She has been exposed to passive smoke. The patient's alcohol use is:  Social History   Substance and Sexual Activity  Alcohol Use No  . Additional information: Last pap ***, next one scheduled for ***.    Review of Systems  Constitutional: Negative.   HENT: Negative.   Eyes: Negative.   Respiratory: Negative.   Cardiovascular: Negative.   Gastrointestinal: Negative.   Endocrine: Negative.   Genitourinary: Negative.   Musculoskeletal: Negative.   Skin: Negative.   Allergic/Immunologic: Negative.   Neurological: Negative.   Hematological: Negative.   Psychiatric/Behavioral: Negative.      There were no vitals filed for this visit. There is no height or weight on file to calculate BMI.   Objective:  Physical Exam Constitutional:      General: She is not in acute distress.  Appearance: Normal appearance. She is well-developed.  HENT:     Head: Normocephalic and atraumatic.     Right Ear: Hearing, tympanic membrane, ear canal and external ear normal. There is no impacted cerumen.     Left Ear: Hearing, tympanic membrane, ear canal and external ear normal. There is no impacted cerumen.     Nose:  Nose normal.     Mouth/Throat:     Mouth: Mucous membranes are dry.  Eyes:     General: Lids are normal.     Extraocular Movements: Extraocular movements intact.     Conjunctiva/sclera: Conjunctivae normal.     Pupils: Pupils are equal, round, and reactive to light.     Funduscopic exam:    Right eye: No papilledema.        Left eye: No papilledema.  Neck:     Thyroid: No thyroid mass.     Vascular: No carotid bruit.  Cardiovascular:     Rate and Rhythm: Normal rate and regular rhythm.     Pulses: Normal pulses.     Heart sounds: Normal heart sounds. No murmur heard.   Pulmonary:     Effort: Pulmonary effort is normal.     Breath sounds: Normal breath sounds.  Abdominal:     General: Abdomen is flat. Bowel sounds are normal. There is no distension.     Palpations: Abdomen is soft.     Tenderness: There is no abdominal tenderness.  Musculoskeletal:        General: No swelling. Normal range of motion.     Cervical back: Full passive range of motion without pain, normal range of motion and neck supple.     Right lower leg: No edema.     Left lower leg: No edema.  Skin:    General: Skin is warm and dry.     Capillary Refill: Capillary refill takes less than 2 seconds.  Neurological:     General: No focal deficit present.     Mental Status: She is alert and oriented to person, place, and time.     Cranial Nerves: No cranial nerve deficit.     Sensory: No sensory deficit.  Psychiatric:        Mood and Affect: Mood normal.        Behavior: Behavior normal.        Thought Content: Thought content normal.        Judgment: Judgment normal.         Assessment And Plan:     1. Encounter for annual physical exam  2. Hypertensive nephropathy  3. Stage 3b chronic kidney disease ([facility])     Patient was given opportunity to ask questions. Patient verbalized understanding of the plan and was able to repeat key elements of the plan. All questions were answered to their  satisfaction.   Mariam Dollar, CMA   I, Mariam Dollar, CMA, have reviewed all documentation for this visit. The documentation on 07/06/20 for the exam, diagnosis, procedures, and orders are all accurate and complete.  THE PATIENT IS ENCOURAGED TO PRACTICE SOCIAL DISTANCING DUE TO THE COVID-19 PANDEMIC.

## 2020-07-06 NOTE — Patient Instructions (Addendum)

## 2020-07-07 LAB — CBC
Hematocrit: 36.2 % (ref 34.0–46.6)
Hemoglobin: 12 g/dL (ref 11.1–15.9)
MCH: 31.7 pg (ref 26.6–33.0)
MCHC: 33.1 g/dL (ref 31.5–35.7)
MCV: 96 fL (ref 79–97)
Platelets: 228 10*3/uL (ref 150–450)
RBC: 3.78 x10E6/uL (ref 3.77–5.28)
RDW: 11.7 % (ref 11.7–15.4)
WBC: 7.4 10*3/uL (ref 3.4–10.8)

## 2020-07-07 LAB — CMP14+EGFR
ALT: 8 IU/L (ref 0–32)
AST: 18 IU/L (ref 0–40)
Albumin/Globulin Ratio: 1.2 (ref 1.2–2.2)
Albumin: 3.9 g/dL (ref 3.6–4.6)
Alkaline Phosphatase: 64 IU/L (ref 44–121)
BUN/Creatinine Ratio: 19 (ref 12–28)
BUN: 22 mg/dL (ref 8–27)
Bilirubin Total: 0.3 mg/dL (ref 0.0–1.2)
CO2: 22 mmol/L (ref 20–29)
Calcium: 9.3 mg/dL (ref 8.7–10.3)
Chloride: 107 mmol/L — ABNORMAL HIGH (ref 96–106)
Creatinine, Ser: 1.17 mg/dL — ABNORMAL HIGH (ref 0.57–1.00)
GFR calc Af Amer: 49 mL/min/{1.73_m2} — ABNORMAL LOW (ref >59)
GFR calc non Af Amer: 42 mL/min/{1.73_m2} — ABNORMAL LOW (ref >59)
Globulin, Total: 3.3 g/dL (ref 1.5–4.5)
Glucose: 96 mg/dL (ref 65–99)
Potassium: 4.3 mmol/L (ref 3.5–5.2)
Sodium: 140 mmol/L (ref 134–144)
Total Protein: 7.2 g/dL (ref 6.0–8.5)

## 2020-07-14 ENCOUNTER — Other Ambulatory Visit: Payer: Self-pay | Admitting: Internal Medicine

## 2020-07-19 LAB — POC INFLUENZA A&B (BINAX/QUICKVUE)
Influenza A, POC: NEGATIVE
Influenza B, POC: NEGATIVE

## 2020-07-19 LAB — POC COVID19 BINAXNOW: SARS Coronavirus 2 Ag: NEGATIVE

## 2020-08-16 ENCOUNTER — Other Ambulatory Visit: Payer: Self-pay | Admitting: Internal Medicine

## 2020-08-18 ENCOUNTER — Encounter: Payer: Medicare Other | Admitting: Nurse Practitioner

## 2020-12-01 ENCOUNTER — Ambulatory Visit (INDEPENDENT_AMBULATORY_CARE_PROVIDER_SITE_OTHER): Payer: Medicare Other | Admitting: Internal Medicine

## 2020-12-01 ENCOUNTER — Other Ambulatory Visit: Payer: Self-pay

## 2020-12-01 ENCOUNTER — Encounter: Payer: Self-pay | Admitting: Internal Medicine

## 2020-12-01 VITALS — BP 122/78 | HR 88 | Temp 98.3°F | Ht 60.8 in | Wt 180.6 lb

## 2020-12-01 DIAGNOSIS — R718 Other abnormality of red blood cells: Secondary | ICD-10-CM | POA: Diagnosis not present

## 2020-12-01 DIAGNOSIS — J4520 Mild intermittent asthma, uncomplicated: Secondary | ICD-10-CM

## 2020-12-01 DIAGNOSIS — N1832 Chronic kidney disease, stage 3b: Secondary | ICD-10-CM

## 2020-12-01 DIAGNOSIS — D649 Anemia, unspecified: Secondary | ICD-10-CM | POA: Diagnosis not present

## 2020-12-01 DIAGNOSIS — K644 Residual hemorrhoidal skin tags: Secondary | ICD-10-CM | POA: Diagnosis not present

## 2020-12-01 DIAGNOSIS — E6609 Other obesity due to excess calories: Secondary | ICD-10-CM

## 2020-12-01 DIAGNOSIS — K625 Hemorrhage of anus and rectum: Secondary | ICD-10-CM | POA: Diagnosis not present

## 2020-12-01 DIAGNOSIS — Z6834 Body mass index (BMI) 34.0-34.9, adult: Secondary | ICD-10-CM

## 2020-12-01 DIAGNOSIS — R799 Abnormal finding of blood chemistry, unspecified: Secondary | ICD-10-CM | POA: Diagnosis not present

## 2020-12-01 DIAGNOSIS — I129 Hypertensive chronic kidney disease with stage 1 through stage 4 chronic kidney disease, or unspecified chronic kidney disease: Secondary | ICD-10-CM | POA: Diagnosis not present

## 2020-12-01 DIAGNOSIS — Z79899 Other long term (current) drug therapy: Secondary | ICD-10-CM | POA: Diagnosis not present

## 2020-12-01 MED ORDER — HYDROCORTISONE ACETATE 25 MG RE SUPP: 25.0000 mg | Freq: Two times a day (BID) | RECTAL | 0 refills | Status: DC

## 2020-12-01 NOTE — Progress Notes (Addendum)
I,Katawbba Wiggins,acting as a Education administrator for [name], MD.,have documented all relevant documentation on the behalf of [name], MD,as directed by  [name], MD while in the presence of [name], MD.  This visit occurred during the SARS-CoV-2 public health emergency.  Safety protocols were in place, including screening questions prior to the visit, additional usage of staff PPE, and extensive cleaning of exam room while observing appropriate contact time as indicated for disinfecting solutions.  Subjective:     Patient ID: Christina Terry , female    DOB: 1932/07/21 , 87 y.o.   MRN: [id number]   Chief Complaint  Patient presents with  . Hemorrhoids    Colonoscopy referral    HPI  The patient is here today for evaluation of hemorrhoids and a GI referral.  She reports she had some rectal bleeding over the past week. She is not sure what may have triggered her sx. She denies having any constipation. Denies hard stools. Denies straining during BM. She does report h/o hemorrhoids. She denies having abdominal discomfort. She noticed blood on toilet tissue and reportedly did not see much blood in the toilet. Did not try any otc meds for relief. She is accompanied by her son today.     Past Medical History:  Diagnosis Date  . Acute myocardial infarction, unspecified site, episode of care unspecified   . Unspecified asthma(493.90)   . Unspecified essential hypertension      Family History  Problem Relation Age of Onset  . Emphysema Father   . Alzheimer's disease Father   . Heart disease Maternal Grandmother   . Heart Problems Mother   . Mental illness Mother   . Alzheimer's disease Mother      Current Outpatient Medications:  .  albuterol (PROVENTIL HFA) 108 (90 BASE) MCG/ACT inhaler, Inhale 2 puffs into the lungs every 6 (six) hours as needed for wheezing or shortness of breath (Rescue inahaler)., Disp: 1 Inhaler, Rfl: prn .  DULERA 100-5 MCG/ACT AERO, TAKE 2  PUFFS BY MOUTH TWICE A DAY, Disp: 13 each, Rfl: 6 .  hydrocortisone (ANUSOL-HC) 25 MG suppository, Place 1 suppository (25 mg total) rectally 2 (two) times daily., Disp: 12 suppository, Rfl: 0 .  valsartan-hydrochlorothiazide (DIOVAN-HCT) 160-12.5 MG tablet, TAKE 1 TABLET BY MOUTH EVERY DAY, Disp: 90 tablet, Rfl: 2   Allergies  Allergen Reactions  . Aspirin     REACTION: wheeze  . Sulfonamide Derivatives      Review of Systems  Constitutional: Negative.   Respiratory: Negative.   Cardiovascular: Negative.   Gastrointestinal: Positive for blood in stool.  Neurological: Negative.   Psychiatric/Behavioral: Negative.      Today's Vitals   12/01/20 1113  BP: 122/78  Pulse: 88  Temp: 98.3 F (36.8 C)  TempSrc: Oral  Weight: 180 lb 9.6 oz (81.9 kg)  Height: 5' 0.8" (1.544 m)   Body mass index is 34.35 kg/m.  Wt Readings from Last 3 Encounters:  12/01/20 180 lb 9.6 oz (81.9 kg)  07/06/20 179 lb 3.2 oz (81.3 kg)  03/04/20 182 lb (82.6 kg)   Objective:  Physical Exam Vitals and nursing note reviewed.  Constitutional:      Appearance: Normal appearance.  HENT:     Head: Normocephalic and atraumatic.     Nose:     Comments: Masked     Mouth/Throat:     Comments: Masked  Cardiovascular:     Rate and Rhythm: Normal rate and regular rhythm.  Heart sounds: Normal heart sounds.  Pulmonary:     Effort: Pulmonary effort is normal.     Breath sounds: Normal breath sounds.  Genitourinary:    Rectum: Guaiac result negative. External hemorrhoid present. No mass.  Musculoskeletal:     Cervical back: Normal range of motion.  Skin:    General: Skin is warm.  Neurological:     General: No focal deficit present.     Mental Status: She is alert.  Psychiatric:        Mood and Affect: Mood normal.        Behavior: Behavior normal.         Assessment And Plan:     1. BRBPR (bright red blood per rectum) Comments: I will check labs as listed below. Oon exam, stool is heme  positive. Pt advised that her sx could be due to ext. hemorrhoids. - CBC with Diff - Iron and IBC ([id number]) - Ferritin - POC Hemoccult Bld/Stl (1-Cd Office Dx)  2. External hemorrhoid Comments: This is likely the cause of her rectal bleeding. She was given rx Anusol-HC suppositories to use as needed.   3. Hypertensive nephropathy Comments: Chronic, well controlled. I will not make any medication changes at this time. I will check renal function today.  - CMP14+EGFR  4. Stage 3b chronic kidney disease ([place]) Comments: I will check renal function today. I will also check Renal labs as well. - Protein electrophoresis, serum - Parathyroid Hormone, Intact w/Ca - Phosphorus  5. Mild intermittent asthma without complication Comments: This appears to be stable. Encouraged to wear mask when outdoors due to high pollen count.   6. Class 1 obesity due to excess calories with serious comorbidity and body mass index (BMI) of 34.0 to 34.9 in adult  She is encouraged to strive for BMI less than 30 to decrease cardiac risk. Advised to aim for at least 150 minutes of exercise per week.   Patient was given opportunity to ask questions. Patient verbalized understanding of the plan and was able to repeat key elements of the plan. All questions were answered to their satisfaction.   I, [name], MD, have reviewed all documentation for this visit. The documentation on 12/06/20 for the exam, diagnosis, procedures, and orders are all accurate and complete.   IF YOU HAVE BEEN REFERRED TO A SPECIALIST, IT MAY TAKE 1-2 WEEKS TO SCHEDULE/PROCESS THE REFERRAL. IF YOU HAVE NOT HEARD FROM US/SPECIALIST IN TWO WEEKS, PLEASE GIVE [place] A CALL AT [phone].   THE PATIENT IS ENCOURAGED TO PRACTICE SOCIAL DISTANCING DUE TO THE COVID-19 PANDEMIC.

## 2020-12-03 LAB — CBC WITH DIFFERENTIAL/PLATELET
Basophils Absolute: 0.1 10*3/uL (ref 0.0–0.2)
Basos: 1 %
EOS (ABSOLUTE): 0.2 10*3/uL (ref 0.0–0.4)
Eos: 3 %
Hematocrit: 37.7 % (ref 34.0–46.6)
Hemoglobin: 12.4 g/dL (ref 11.1–15.9)
Immature Grans (Abs): 0 10*3/uL (ref 0.0–0.1)
Immature Granulocytes: 0 %
Lymphocytes Absolute: 1.3 10*3/uL (ref 0.7–3.1)
Lymphs: 23 %
MCH: 32.2 pg (ref 26.6–33.0)
MCHC: 32.9 g/dL (ref 31.5–35.7)
MCV: 98 fL — ABNORMAL HIGH (ref 79–97)
Monocytes Absolute: 0.6 10*3/uL (ref 0.1–0.9)
Monocytes: 11 %
Neutrophils Absolute: 3.6 10*3/uL (ref 1.4–7.0)
Neutrophils: 62 %
Platelets: 217 10*3/uL (ref 150–450)
RBC: 3.85 x10E6/uL (ref 3.77–5.28)
RDW: 11.6 % — ABNORMAL LOW (ref 11.7–15.4)
WBC: 5.8 10*3/uL (ref 3.4–10.8)

## 2020-12-03 LAB — CMP14+EGFR
ALT: 16 IU/L (ref 0–32)
AST: 38 IU/L (ref 0–40)
Albumin/Globulin Ratio: 1.2 (ref 1.2–2.2)
Albumin: 4.2 g/dL (ref 3.6–4.6)
Alkaline Phosphatase: 53 IU/L (ref 44–121)
BUN/Creatinine Ratio: 22 (ref 12–28)
BUN: 28 mg/dL — ABNORMAL HIGH (ref 8–27)
Bilirubin Total: 0.4 mg/dL (ref 0.0–1.2)
CO2: 20 mmol/L (ref 20–29)
Calcium: 9.8 mg/dL (ref 8.7–10.3)
Chloride: 105 mmol/L (ref 96–106)
Creatinine, Ser: 1.27 mg/dL — ABNORMAL HIGH (ref 0.57–1.00)
Globulin, Total: 3.4 g/dL (ref 1.5–4.5)
Glucose: 95 mg/dL (ref 65–99)
Potassium: 4.9 mmol/L (ref 3.5–5.2)
Sodium: 143 mmol/L (ref 134–144)
Total Protein: 7.6 g/dL (ref 6.0–8.5)
eGFR: 41 mL/min/{1.73_m2} — ABNORMAL LOW (ref >59)

## 2020-12-03 LAB — PHOSPHORUS: Phosphorus: 3.2 mg/dL (ref 3.0–4.3)

## 2020-12-03 LAB — PROTEIN ELECTROPHORESIS, SERUM
A/G Ratio: 1.1 (ref 0.7–1.7)
Albumin ELP: 3.9 g/dL (ref 2.9–4.4)
Alpha 1: 0.2 g/dL (ref 0.0–0.4)
Alpha 2: 0.7 g/dL (ref 0.4–1.0)
Beta: 1.1 g/dL (ref 0.7–1.3)
Gamma Globulin: 1.8 g/dL (ref 0.4–1.8)
Globulin, Total: 3.7 g/dL (ref 2.2–3.9)

## 2020-12-03 LAB — FERRITIN: Ferritin: 867 ng/mL — ABNORMAL HIGH (ref 15–150)

## 2020-12-03 LAB — PTH, INTACT AND CALCIUM: PTH: 44 pg/mL (ref 15–65)

## 2020-12-03 LAB — IRON AND TIBC
Iron Saturation: 32 % (ref 15–55)
Iron: 73 ug/dL (ref 27–139)
Total Iron Binding Capacity: 225 ug/dL — ABNORMAL LOW (ref 250–450)
UIBC: 152 ug/dL (ref 118–369)

## 2020-12-06 ENCOUNTER — Telehealth: Payer: Self-pay

## 2020-12-06 LAB — POC HEMOCCULT BLD/STL (OFFICE/1-CARD/DIAGNOSTIC)
Card #1 Date: 4132022
Fecal Occult Blood, POC: POSITIVE — AB

## 2020-12-06 NOTE — Addendum Note (Signed)
Addended by: [name] on: 12/06/2020 10:42 PM   Modules accepted: Orders

## 2020-12-06 NOTE — Telephone Encounter (Signed)
I returned the pt's call, the pt left on her voicemail that she was calling to say she doesn't see anything.  I left a message for her to call the office back.

## 2020-12-07 ENCOUNTER — Other Ambulatory Visit: Payer: Self-pay

## 2020-12-07 MED ORDER — HYDROCORTISONE ACETATE 25 MG RE SUPP: 25.0000 mg | Freq: Two times a day (BID) | RECTAL | 0 refills | Status: AC

## 2020-12-08 LAB — B12 AND FOLATE PANEL
Folate: 8 ng/mL (ref >3.0)
Vitamin B-12: 493 pg/mL (ref 232–1245)

## 2020-12-08 LAB — SPECIMEN STATUS REPORT

## 2020-12-14 ENCOUNTER — Other Ambulatory Visit: Payer: Self-pay | Admitting: Internal Medicine

## 2020-12-14 DIAGNOSIS — K625 Hemorrhage of anus and rectum: Secondary | ICD-10-CM

## 2021-03-16 ENCOUNTER — Ambulatory Visit: Payer: Medicare Other | Admitting: Internal Medicine

## 2021-03-16 ENCOUNTER — Telehealth: Payer: Self-pay

## 2021-03-16 NOTE — Telephone Encounter (Signed)
This nurse called patient in regards to missed appointment. She states that she forgot and no one called her to remind her the day before. Rescheduled to 04/20/2021.

## 2021-04-20 ENCOUNTER — Ambulatory Visit (INDEPENDENT_AMBULATORY_CARE_PROVIDER_SITE_OTHER): Payer: Medicare Other | Admitting: Internal Medicine

## 2021-04-20 ENCOUNTER — Ambulatory Visit (INDEPENDENT_AMBULATORY_CARE_PROVIDER_SITE_OTHER): Payer: Medicare Other

## 2021-04-20 ENCOUNTER — Encounter: Payer: Self-pay | Admitting: Internal Medicine

## 2021-04-20 ENCOUNTER — Other Ambulatory Visit: Payer: Self-pay

## 2021-04-20 VITALS — BP 130/60 | HR 93 | Temp 97.9°F | Ht 60.4 in | Wt 178.8 lb

## 2021-04-20 DIAGNOSIS — I129 Hypertensive chronic kidney disease with stage 1 through stage 4 chronic kidney disease, or unspecified chronic kidney disease: Secondary | ICD-10-CM | POA: Diagnosis not present

## 2021-04-20 DIAGNOSIS — R7989 Other specified abnormal findings of blood chemistry: Secondary | ICD-10-CM

## 2021-04-20 DIAGNOSIS — N1832 Chronic kidney disease, stage 3b: Secondary | ICD-10-CM | POA: Diagnosis not present

## 2021-04-20 DIAGNOSIS — R251 Tremor, unspecified: Secondary | ICD-10-CM | POA: Diagnosis not present

## 2021-04-20 DIAGNOSIS — R0982 Postnasal drip: Secondary | ICD-10-CM

## 2021-04-20 DIAGNOSIS — Z79899 Other long term (current) drug therapy: Secondary | ICD-10-CM

## 2021-04-20 DIAGNOSIS — Z0000 Encounter for general adult medical examination without abnormal findings: Secondary | ICD-10-CM | POA: Diagnosis not present

## 2021-04-20 MED ORDER — LORATADINE 10 MG PO TABS: 10.0000 mg | ORAL_TABLET | Freq: Every day | ORAL | 2 refills | Status: DC | PRN

## 2021-04-20 MED ORDER — VALSARTAN-HYDROCHLOROTHIAZIDE 160-12.5 MG PO TABS: 1.0000 | ORAL_TABLET | Freq: Every day | ORAL | 2 refills | Status: DC

## 2021-04-20 NOTE — Patient Instructions (Signed)

## 2021-04-20 NOTE — Progress Notes (Signed)
This visit occurred during the SARS-CoV-2 public health emergency.  Safety protocols were in place, including screening questions prior to the visit, additional usage of staff PPE, and extensive cleaning of exam room while observing appropriate contact time as indicated for disinfecting solutions.   Subjective:   Christina Terry is a 87 y.o. female who presents for Medicare Annual (Subsequent) preventive examination.  Review of Systems     Cardiac Risk Factors include: advanced age (>855men, 22>65 women);hypertension;obesity (BMI >30kg/m2);sedentary lifestyle     Objective:    Today's Vitals   04/20/21 1010 04/20/21 1040  BP: 138/66 130/60  Pulse: 93   Temp: 97.9 F (36.6 C)   TempSrc: Oral   SpO2: 97%   Weight: 178 lb 12.8 oz (81.1 kg)   Height: 5' 0.4" (1.534 m)    Body mass index is 34.46 kg/m.  Advanced Directives 04/20/2021 03/04/2020 02/20/2019  Does Patient Have a Medical Advance Directive? Yes No Yes  Type of Estate agentAdvance Directive Healthcare Power of [place];Living will - Healthcare Power of [place];Living will  Copy of Healthcare Power of Attorney in Chart? No - copy requested - No - copy requested  Would patient like information on creating a medical advance directive? - No - Patient declined -    Current Medications (verified) Outpatient Encounter Medications as of 04/20/2021  Medication Sig   DULERA 100-5 MCG/ACT AERO TAKE 2 PUFFS BY MOUTH TWICE A DAY   hydrocortisone (ANUSOL-HC) 25 MG suppository Place 1 suppository (25 mg total) rectally 2 (two) times daily.   valsartan-hydrochlorothiazide (DIOVAN-HCT) 160-12.5 MG tablet TAKE 1 TABLET BY MOUTH EVERY DAY   albuterol (PROVENTIL HFA) 108 (90 BASE) MCG/ACT inhaler Inhale 2 puffs into the lungs every 6 (six) hours as needed for wheezing or shortness of breath ([facility] in[facility]aler).   No facility-administered encounter medications on file as of 04/20/2021.    Allergies (verified) Aspirin and Sulfonamide derivatives    History: Past Medical History:  Diagnosis Date   Acute myocardial infarction, unspecified site, episode of care unspecified    Unspecified asthma(493.90)    Unspecified essential hypertension    Past Surgical History:  Procedure Laterality Date   sinus surgey     TOTAL ABDOMINAL HYSTERECTOMY W/ BILATERAL SALPINGOOPHORECTOMY     Family History  Problem Relation Age of Onset   Emphysema Father    Alzheimer's disease Father    Heart disease Maternal Grandmother    Heart Problems Mother    Mental illness Mother    Alzheimer's disease Mother    Social History   Socioeconomic History   Marital status: Widowed    Spouse name: Not on file   Number of children: Not on file   Years of education: Not on file   Highest education level: Not on file  Occupational History   Occupation: Cook  Tobacco Use   Smoking status: Never   Smokeless tobacco: Never  Vaping Use   Vaping Use: Never used  Substance and Sexual Activity   Alcohol use: No   Drug use: No   Sexual activity: Not Currently  Other Topics Concern   Not on file  Social History Narrative   Not on file   Social Determinants of Health   Financial Resource Strain: Low Risk    Difficulty of Paying Living Expenses: Not hard at all  Food Insecurity: No Food Insecurity   Worried About Programme researcher, broadcasting/film/videounning Out of Food in the Last Year: Never true   Ran Out of Food in the Last Year: Never true  Transportation Needs: No Regulatory affairs officer (Medical): No   Lack of Transportation (Non-Medical): No  Physical Activity: Inactive   Days of Exercise per Week: 0 days   Minutes of Exercise per Session: 0 min  Stress: No Stress Concern Present   Feeling of Stress : Not at all  Social Connections: Not on file    Tobacco Counseling Counseling given: Not Answered   Clinical Intake:  Pre-visit preparation completed: Yes  Pain : No/denies pain     Nutritional Status: BMI > 30  Obese Nutritional Risks:  None Diabetes: No  How often do you need to have someone help you when you read instructions, pamphlets, or other written materials from your doctor or pharmacy?: 1 - Never What is the last grade level you completed in school?: 12th grade  Diabetic? no  Interpreter Needed?: No  Information entered by :: NAllen LPN   Activities of Daily Living In your present state of health, do you have any difficulty performing the following activities: 04/20/2021  Hearing? Y  Comment hearing aides  Vision? N  Difficulty concentrating or making decisions? N  Walking or climbing stairs? N  Dressing or bathing? N  Doing errands, shopping? N  Preparing Food and eating ? N  Using the Toilet? N  In the past six months, have you accidently leaked urine? N  Do you have problems with loss of bowel control? N  Managing your Medications? N  Managing your Finances? N  Housekeeping or managing your Housekeeping? N  Some recent data might be hidden    Patient Care Team: Dorothyann Peng, MD as PCP - General (Internal Medicine)  Indicate any recent Medical Services you may have received from other than [facility] providers in the past year (date may be approximate).     Assessment:   This is a routine wellness examination for Christina Terry.  Hearing/Vision screen Vision Screening - Comments:: No regular eye exams, Dr. Dione Booze  Dietary issues and exercise activities discussed: Current Exercise Habits: The patient does not participate in regular exercise at present   Goals Addressed             This Visit's Progress    Patient Stated       04/20/2021, no goals       Depression Screen PHQ 2/9 Scores 04/20/2021 03/04/2020 05/28/2019 02/20/2019 02/20/2019  PHQ - 2 Score 1 0 0 0 0  PHQ- 9 Score - 1 - - -    Fall Risk Fall Risk  04/20/2021 03/04/2020 05/28/2019 02/20/2019 02/20/2019  Falls in the past year? 0 0 0 0 0  Risk for fall due to : Medication side effect Medication side effect - Medication side effect -   Follow up Falls evaluation completed;Education provided;Falls prevention discussed Falls evaluation completed;Education provided;Falls prevention discussed - Falls evaluation completed;Education provided;Falls prevention discussed -    FALL RISK PREVENTION PERTAINING TO THE HOME:  Any stairs in or around the home? Yes  If so, are there any without handrails? No  Home free of loose throw rugs in walkways, pet beds, electrical cords, etc? Yes  Adequate lighting in your home to reduce risk of falls? Yes   ASSISTIVE DEVICES UTILIZED TO PREVENT FALLS:  Life alert? No  Use of a cane, walker or w/c? No  Grab bars in the bathroom? No  Shower chair or bench in shower? No  Elevated toilet seat or a handicapped toilet? Yes   TIMED UP AND GO:  Was the test  performed? No .    Gait slow and steady without use of assistive device  Cognitive Function:     6CIT Screen 04/20/2021 03/04/2020 02/20/2019  What Year? 0 points 0 points 0 points  What month? 0 points 0 points 0 points  What time? 0 points 0 points 3 points  Count back from 20 0 points 0 points 0 points  Months in reverse 2 points 2 points 2 points  Repeat phrase 4 points 4 points 0 points  Total Score 6 6 5     Immunizations Immunization History  Administered Date(s) Administered   Fluad Quad(high Dose 65+) 05/06/2019   Influenza Split 08/02/2011   Influenza Whole 05/19/2009, 05/21/2010   Influenza, High Dose Seasonal PF 06/04/2018   Influenza-Unspecified 05/06/2019   Moderna Sars-Covid-2 Vaccination 09/11/2019, 10/17/2019, 08/17/2020, 01/25/2021   Pneumococcal Conjugate-13 06/26/2018   Pneumococcal Polysaccharide-23 05/19/2009, 04/29/2020    TDAP status: Up to date  Flu Vaccine status: Due, Education has been provided regarding the importance of this vaccine. Advised may receive this vaccine at local pharmacy or Health Dept. Aware to provide a copy of the vaccination record if obtained from local pharmacy or Health Dept.  Verbalized acceptance and understanding.  Pneumococcal vaccine status: Up to date  Covid-19 vaccine status: Completed vaccines  Qualifies for Shingles Vaccine? Yes   Zostavax completed No   Shingrix Completed?: No.    Education has been provided regarding the importance of this vaccine. Patient has been advised to call insurance company to determine out of pocket expense if they have not yet received this vaccine. Advised may also receive vaccine at local pharmacy or Health Dept. Verbalized acceptance and understanding.  Screening Tests Health Maintenance  Topic Date Due   Zoster Vaccines- Shingrix (1 of 2) Never done   INFLUENZA VACCINE  03/21/2021   COVID-19 Vaccine (5 - Booster for Moderna series) 05/27/2021   TETANUS/TDAP  09/28/2021   DEXA SCAN  Completed   PNA vac Low Risk Adult  Completed   HPV VACCINES  Aged Out    Health Maintenance  Health Maintenance Due  Topic Date Due   Zoster Vaccines- Shingrix (1 of 2) Never done   INFLUENZA VACCINE  03/21/2021    Colorectal cancer screening: No longer required.   Mammogram status: No longer required due to age.  Bone Density status: Completed 12/08/2011.   Lung Cancer Screening: (Low Dose CT Chest recommended if Age 59-80 years, 30 pack-year currently smoking OR have quit w/in 15years.) does not qualify.   Lung Cancer Screening Referral: no  Additional Screening:  Hepatitis C Screening: does not qualify;  Vision Screening: Recommended annual ophthalmology exams for early detection of glaucoma and other disorders of the eye. Is the patient up to date with their annual eye exam?  No  Who is the provider or what is the name of the office in which the patient attends annual eye exams? Dr. 12/10/2011 If pt is not established with a provider, would they like to be referred to a provider to establish care? No .   Dental Screening: Recommended annual dental exams for proper oral hygiene  Community Resource Referral / Chronic Care  Management: CRR required this visit?  No   CCM required this visit?  No      Plan:     I have personally reviewed and noted the following in the patient's chart:   Medical and social history Use of alcohol, tobacco or illicit drugs  Current medications and supplements including opioid prescriptions.  Functional ability and status Nutritional status Physical activity Advanced directives List of other physicians Hospitalizations, surgeries, and ER visits in previous 12 months Vitals Screenings to include cognitive, depression, and falls Referrals and appointments  In addition, I have reviewed and discussed with patient certain preventive protocols, quality metrics, and best practice recommendations. A written personalized care plan for preventive services as well as general preventive health recommendations were provided to patient.     Barb Merino, LPN   [id number]   Nurse Notes:

## 2021-04-20 NOTE — Patient Instructions (Signed)
Christina Terry , Thank you for taking time to come for your Medicare Wellness Visit. I appreciate your ongoing commitment to your health goals. Please review the following plan we discussed and let me know if I can assist you in the future.   Screening recommendations/referrals: Colonoscopy: not required Mammogram: not required Bone Density: completed 12/08/2011 Recommended yearly ophthalmology/optometry visit for glaucoma screening and checkup Recommended yearly dental visit for hygiene and checkup  Vaccinations: Influenza vaccine: due Pneumococcal vaccine: completed 04/29/2020 Tdap vaccine: completed 09/29/2011, due 09/28/2021 Shingles vaccine: discussed   Covid-19: 01/25/2021, 08/17/2020, 10/17/2019, 09/11/2019  Advanced directives: Please bring a copy of your POA (Power of Attorney) and/or Living Will to your next appointment.   Conditions/risks identified: none  Next appointment: Follow up in one year for your annual wellness visit    Preventive Care 65 Years and Older, Female Preventive care refers to lifestyle choices and visits with your health care provider that can promote health and wellness. What does preventive care include? A yearly physical exam. This is also called an annual well check. Dental exams once or twice a year. Routine eye exams. Ask your health care provider how often you should have your eyes checked. Personal lifestyle choices, including: Daily care of your teeth and gums. Regular physical activity. Eating a healthy diet. Avoiding tobacco and drug use. Limiting alcohol use. Practicing safe sex. Taking low-dose aspirin every day. Taking vitamin and mineral supplements as recommended by your health care provider. What happens during an annual well check? The services and screenings done by your health care provider during your annual well check will depend on your age, overall health, lifestyle risk factors, and family history of disease. Counseling  Your  health care provider may ask you questions about your: Alcohol use. Tobacco use. Drug use. Emotional well-being. Home and relationship well-being. Sexual activity. Eating habits. History of falls. Memory and ability to understand (cognition). Work and work Astronomer. Reproductive health. Screening  You may have the following tests or measurements: Height, weight, and BMI. Blood pressure. Lipid and cholesterol levels. These may be checked every 5 years, or more frequently if you are over 42 years old. Skin check. Lung cancer screening. You may have this screening every year starting at age 63 if you have a 30-pack-year history of smoking and currently smoke or have quit within the past 15 years. Fecal occult blood test (FOBT) of the stool. You may have this test every year starting at age 12. Flexible sigmoidoscopy or colonoscopy. You may have a sigmoidoscopy every 5 years or a colonoscopy every 10 years starting at age 28. Hepatitis C blood test. Hepatitis B blood test. Sexually transmitted disease (STD) testing. Diabetes screening. This is done by checking your blood sugar (glucose) after you have not eaten for a while (fasting). You may have this done every 1-3 years. Bone density scan. This is done to screen for osteoporosis. You may have this done starting at age 10. Mammogram. This may be done every 1-2 years. Talk to your health care provider about how often you should have regular mammograms. Talk with your health care provider about your test results, treatment options, and if necessary, the need for more tests. Vaccines  Your health care provider may recommend certain vaccines, such as: Influenza vaccine. This is recommended every year. Tetanus, diphtheria, and acellular pertussis (Tdap, Td) vaccine. You may need a Td booster every 10 years. Zoster vaccine. You may need this after age 44. Pneumococcal 13-valent conjugate (PCV13) vaccine. One dose  is recommended after age  65. Pneumococcal polysaccharide (PPSV23) vaccine. One dose is recommended after age 7. Talk to your health care provider about which screenings and vaccines you need and how often you need them. This information is not intended to replace advice given to you by your health care provider. Make sure you discuss any questions you have with your health care provider. Document Released: 09/03/2015 Document Revised: 04/26/2016 Document Reviewed: 06/08/2015 Elsevier Interactive Patient Education  2017 [place] Prevention in the Home Falls can cause injuries. They can happen to people of all ages. There are many things you can do to make your home safe and to help prevent falls. What can I do on the outside of my home? Regularly fix the edges of walkways and driveways and fix any cracks. Remove anything that might make you trip as you walk through a door, such as a raised step or threshold. Trim any bushes or trees on the path to your home. Use bright outdoor lighting. Clear any walking paths of anything that might make someone trip, such as rocks or tools. Regularly check to see if handrails are loose or broken. Make sure that both sides of any steps have handrails. Any raised decks and porches should have guardrails on the edges. Have any leaves, snow, or ice cleared regularly. Use sand or salt on walking paths during winter. Clean up any spills in your garage right away. This includes oil or grease spills. What can I do in the bathroom? Use night lights. Install grab bars by the toilet and in the tub and shower. Do not use towel bars as grab bars. Use non-skid mats or decals in the tub or shower. If you need to sit down in the shower, use a plastic, non-slip stool. Keep the floor dry. Clean up any water that spills on the floor as soon as it happens. Remove soap buildup in the tub or shower regularly. Attach bath mats securely with double-sided non-slip rug tape. Do not have throw  rugs and other things on the floor that can make you trip. What can I do in the bedroom? Use night lights. Make sure that you have a light by your bed that is easy to reach. Do not use any sheets or blankets that are too big for your bed. They should not hang down onto the floor. Have a firm chair that has side arms. You can use this for support while you get dressed. Do not have throw rugs and other things on the floor that can make you trip. What can I do in the kitchen? Clean up any spills right away. Avoid walking on wet floors. Keep items that you use a lot in easy-to-reach places. If you need to reach something above you, use a strong step stool that has a grab bar. Keep electrical cords out of the way. Do not use floor polish or wax that makes floors slippery. If you must use wax, use non-skid floor wax. Do not have throw rugs and other things on the floor that can make you trip. What can I do with my stairs? Do not leave any items on the stairs. Make sure that there are handrails on both sides of the stairs and use them. Fix handrails that are broken or loose. Make sure that handrails are as long as the stairways. Check any carpeting to make sure that it is firmly attached to the stairs. Fix any carpet that is loose or worn. Avoid  having throw rugs at the top or bottom of the stairs. If you do have throw rugs, attach them to the floor with carpet tape. Make sure that you have a light switch at the top of the stairs and the bottom of the stairs. If you do not have them, ask someone to add them for you. What else can I do to help prevent falls? Wear shoes that: Do not have high heels. Have rubber bottoms. Are comfortable and fit you well. Are closed at the toe. Do not wear sandals. If you use a stepladder: Make sure that it is fully opened. Do not climb a closed stepladder. Make sure that both sides of the stepladder are locked into place. Ask someone to hold it for you, if  possible. Clearly mark and make sure that you can see: Any grab bars or handrails. First and last steps. Where the edge of each step is. Use tools that help you move around (mobility aids) if they are needed. These include: Canes. Walkers. Scooters. Crutches. Turn on the lights when you go into a dark area. Replace any light bulbs as soon as they burn out. Set up your furniture so you have a clear path. Avoid moving your furniture around. If any of your floors are uneven, fix them. If there are any pets around you, be aware of where they are. Review your medicines with your doctor. Some medicines can make you feel dizzy. This can increase your chance of falling. Ask your doctor what other things that you can do to help prevent falls. This information is not intended to replace advice given to you by your health care provider. Make sure you discuss any questions you have with your health care provider. Document Released: 06/03/2009 Document Revised: 01/13/2016 Document Reviewed: 09/11/2014 Elsevier Interactive Patient Education  2017 [employer].

## 2021-04-20 NOTE — Progress Notes (Signed)
Earleen Newport as a Education administrator for [name], MD.,have documented all relevant documentation on the behalf of [name], MD,as directed by  [name], MD while in the presence of [name], MD.  This visit occurred during the SARS-CoV-2 public health emergency.  Safety protocols were in place, including screening questions prior to the visit, additional usage of staff PPE, and extensive cleaning of exam room while observing appropriate contact time as indicated for disinfecting solutions.  Subjective:     Patient ID: Christina Terry , female    DOB: 04/04/1932 , 87 y.o.   MRN: [id number]   Chief Complaint  Patient presents with   Hypertension    HPI  Pt presents today for BP & prediabetes f/u. She reports compliance with meds. Denies headaches,chest pain and shortness of breath. She does admit to something dripping down the back of her throat. No fever/chills.   Hypertension This is a chronic problem. The current episode started more than 1 year ago. The problem has been gradually improving since onset. The problem is controlled. Pertinent negatives include no blurred vision, chest pain, palpitations or shortness of breath. Past treatments include angiotensin blockers and diuretics. The current treatment provides moderate improvement. Compliance problems include exercise.  Hypertensive end-organ damage includes kidney disease.  Sinus Problem Pertinent negatives include no shortness of breath, sore throat or swollen glands. Past treatments include nothing. The treatment provided no relief.    Past Medical History:  Diagnosis Date   Acute myocardial infarction, unspecified site, episode of care unspecified    Unspecified asthma(493.90)    Unspecified essential hypertension      Family History  Problem Relation Age of Onset   Emphysema Father    Alzheimer's disease Father    Heart disease Maternal Grandmother    Heart Problems Mother    Mental illness Mother     Alzheimer's disease Mother      Current Outpatient Medications:    loratadine (CLARITIN) 10 MG tablet, Take 1 tablet (10 mg total) by mouth daily as needed for allergies., Disp: 30 tablet, Rfl: 2   albuterol (PROVENTIL HFA) 108 (90 BASE) MCG/ACT inhaler, Inhale 2 puffs into the lungs every 6 (six) hours as needed for wheezing or shortness of breath ([facility]cue inahaler)., Disp: 1 Inhaler, Rfl: prn   DULERA 100-5 MCG/ACT AERO, TAKE 2 PUFFS BY MOUTH TWICE A DAY, Disp: 13 each, Rfl: 6   hydrocortisone (ANUSOL-HC) 25 MG suppository, Place 1 suppository (25 mg total) rectally 2 (two) times daily., Disp: 12 suppository, Rfl: 0   valsartan-hydrochlorothiazide (DIOVAN-HCT) 160-12.5 MG tablet, Take 1 tablet by mouth daily., Disp: 90 tablet, Rfl: 2   Allergies  Allergen Reactions   Aspirin     REACTION: wheeze   Sulfonamide Derivatives      Review of Systems  Constitutional: Negative.   HENT:  Positive for postnasal drip. Negative for sore throat.   Eyes:  Negative for blurred vision.  Respiratory: Negative.  Negative for shortness of breath.   Cardiovascular: Negative.  Negative for chest pain and palpitations.  Neurological: Negative.   Psychiatric/Behavioral: Negative.      Today's Vitals   04/20/21 1021  BP: 130/60  Pulse: 93  Temp: 97.9 F (36.6 C)  Weight: 178 lb 12.7 oz (81.1 kg)  Height: 5' 0.4" (1.534 m)   Body mass index is 34.46 kg/m.   Objective:  Physical Exam Vitals and nursing note reviewed.  Constitutional:      Appearance: Normal appearance.  HENT:     Head: Normocephalic and atraumatic.     Nose:     Comments: Masked     Mouth/Throat:     Comments: Masked  Eyes:     Extraocular Movements: Extraocular movements intact.  Cardiovascular:     Rate and Rhythm: Normal rate and regular rhythm.     Heart sounds: Normal heart sounds.  Pulmonary:     Effort: Pulmonary effort is normal.     Breath sounds: Normal breath sounds.  Musculoskeletal:     Cervical  back: Normal range of motion.  Skin:    General: Skin is warm.  Neurological:     General: No focal deficit present.     Mental Status: She is alert.     Comments: Tremor is present in r hand  Psychiatric:        Mood and Affect: Mood normal.        Behavior: Behavior normal.        Assessment And Plan:     1. Hypertensive nephropathy Comments: Chronic, controlled. No med changes today. She is encouraged to cut back on her salt intake.  - BMP8+eGFR - Insulin, random(561)  2. Stage 3b chronic kidney disease ([place]) Comments: Chronic, I will check renal function today. She is encouraged to keep BP controlled and to stay well hydrated.   3. Postnasal drip Comments: I will send rx loratadine 59m daily.   4. Abnormal thyroid blood test Comments: I will check thyroid panel and initiate therapy as needed.  - TSH - T4, free  5. Tremor Comments: Present in right hand. Occurs at rest.  6. Polypharmacy Comments: She agrees to ACTX testing.  - [place] process and benefits of testing was discussed with the patient. After the discussion, the patient made an informed consent to testing and the sample was collected and mailed to the external lab.  Patient was given opportunity to ask questions. Patient verbalized understanding of the plan and was able to repeat key elements of the plan. All questions were answered to their satisfaction.   I, RMaximino Greenland MD, have reviewed all documentation for this visit. The documentation on 05/01/21 for the exam, diagnosis, procedures, and orders are all accurate and complete.   IF YOU HAVE BEEN REFERRED TO A SPECIALIST, IT MAY TAKE 1-2 WEEKS TO SCHEDULE/PROCESS THE REFERRAL. IF YOU HAVE NOT HEARD FROM US/SPECIALIST IN TWO WEEKS, PLEASE GIVE [place] CALL AT [phone].   THE PATIENT IS ENCOURAGED TO PRACTICE SOCIAL DISTANCING DUE TO THE COVID-19 PANDEMIC.

## 2021-04-21 LAB — BMP8+EGFR
BUN/Creatinine Ratio: 24 (ref 12–28)
BUN: 30 mg/dL — ABNORMAL HIGH (ref 8–27)
CO2: 22 mmol/L (ref 20–29)
Calcium: 9.7 mg/dL (ref 8.7–10.3)
Chloride: 103 mmol/L (ref 96–106)
Creatinine, Ser: 1.26 mg/dL — ABNORMAL HIGH (ref 0.57–1.00)
Glucose: 91 mg/dL (ref 65–99)
Potassium: 4.2 mmol/L (ref 3.5–5.2)
Sodium: 139 mmol/L (ref 134–144)
eGFR: 41 mL/min/{1.73_m2} — ABNORMAL LOW (ref >59)

## 2021-04-21 LAB — TSH: TSH: 0.315 u[IU]/mL — ABNORMAL LOW (ref 0.450–4.500)

## 2021-04-21 LAB — INSULIN, RANDOM: INSULIN: 15.7 u[IU]/mL (ref 2.6–24.9)

## 2021-04-21 LAB — T4, FREE: Free T4: 1.3 ng/dL (ref 0.82–1.77)

## 2021-05-04 ENCOUNTER — Other Ambulatory Visit: Payer: Self-pay | Admitting: Internal Medicine

## 2021-05-04 DIAGNOSIS — Z79899 Other long term (current) drug therapy: Secondary | ICD-10-CM

## 2021-06-17 ENCOUNTER — Other Ambulatory Visit: Payer: Self-pay | Admitting: Internal Medicine

## 2021-06-17 DIAGNOSIS — E0590 Thyrotoxicosis, unspecified without thyrotoxic crisis or storm: Secondary | ICD-10-CM

## 2021-07-17 ENCOUNTER — Other Ambulatory Visit: Payer: Self-pay | Admitting: Internal Medicine

## 2021-07-18 ENCOUNTER — Encounter: Payer: Self-pay | Admitting: Internal Medicine

## 2021-07-18 ENCOUNTER — Other Ambulatory Visit: Payer: Self-pay

## 2021-07-18 ENCOUNTER — Ambulatory Visit (INDEPENDENT_AMBULATORY_CARE_PROVIDER_SITE_OTHER): Payer: Medicare Other | Admitting: Internal Medicine

## 2021-07-18 VITALS — BP 130/70 | HR 60 | Temp 98.1°F | Ht 60.4 in | Wt 179.4 lb

## 2021-07-18 DIAGNOSIS — E6609 Other obesity due to excess calories: Secondary | ICD-10-CM

## 2021-07-18 DIAGNOSIS — Z0000 Encounter for general adult medical examination without abnormal findings: Secondary | ICD-10-CM

## 2021-07-18 DIAGNOSIS — I129 Hypertensive chronic kidney disease with stage 1 through stage 4 chronic kidney disease, or unspecified chronic kidney disease: Secondary | ICD-10-CM | POA: Diagnosis not present

## 2021-07-18 DIAGNOSIS — H9193 Unspecified hearing loss, bilateral: Secondary | ICD-10-CM | POA: Diagnosis not present

## 2021-07-18 DIAGNOSIS — R0981 Nasal congestion: Secondary | ICD-10-CM | POA: Diagnosis not present

## 2021-07-18 DIAGNOSIS — R7989 Other specified abnormal findings of blood chemistry: Secondary | ICD-10-CM | POA: Diagnosis not present

## 2021-07-18 DIAGNOSIS — N1832 Chronic kidney disease, stage 3b: Secondary | ICD-10-CM | POA: Diagnosis not present

## 2021-07-18 DIAGNOSIS — Z6834 Body mass index (BMI) 34.0-34.9, adult: Secondary | ICD-10-CM

## 2021-07-18 LAB — POCT URINALYSIS DIPSTICK
Bilirubin, UA: NEGATIVE
Blood, UA: NEGATIVE
Glucose, UA: NEGATIVE
Ketones, UA: NEGATIVE
Leukocytes, UA: NEGATIVE
Nitrite, UA: NEGATIVE
Protein, UA: NEGATIVE
Spec Grav, UA: 1.01 (ref 1.010–1.025)
Urobilinogen, UA: 0.2 E.U./dL
pH, UA: 5 (ref 5.0–8.0)

## 2021-07-18 LAB — POCT UA - MICROALBUMIN
Albumin/Creatinine Ratio, Urine, POC: <30
Creatinine, POC: 100 mg/dL
Microalbumin Ur, POC: 10 mg/L

## 2021-07-18 NOTE — Progress Notes (Signed)
I,Christina Terry,acting as a Education administrator for Christina Greenland, MD.,have documented all relevant documentation on the behalf of Christina Greenland, MD,as directed by  Christina Greenland, MD while in the presence of Christina Greenland, MD.  This visit occurred during the SARS-CoV-2 public health emergency.  Safety protocols were in place, including screening questions prior to the visit, additional usage of staff PPE, and extensive cleaning of exam room while observing appropriate contact time as indicated for disinfecting solutions.  Subjective:     Patient ID: Christina Terry , female    DOB: 08/10/1932 , 87 y.o.   MRN: [id number]   Chief Complaint  Patient presents with   Annual Exam   Hypertension    HPI  Patient presents today for a physical exam. She reports compliance with meds. She denies headaches, chest pain and shortness of breath. Patient has no complaints at this time.   Hypertension This is a chronic problem. The current episode started more than 1 year ago. The problem has been gradually improving since onset. The problem is controlled. Pertinent negatives include no blurred vision, chest pain, palpitations or shortness of breath. Risk factors for coronary artery disease include obesity, post-menopausal state and sedentary lifestyle. Past treatments include angiotensin blockers. The current treatment provides moderate improvement. Compliance problems include exercise.     Past Medical History:  Diagnosis Date   Acute myocardial infarction, unspecified site, episode of care unspecified    Unspecified asthma(493.90)    Unspecified essential hypertension      Family History  Problem Relation Age of Onset   Emphysema Father    Alzheimer's disease Father    Heart disease Maternal Grandmother    Heart Problems Mother    Mental illness Mother    Alzheimer's disease Mother      Current Outpatient Medications:    DULERA 100-5 MCG/ACT AERO, TAKE 2 PUFFS BY MOUTH TWICE A DAY, Disp: 13 each,  Rfl: 6   loratadine (CLARITIN) 10 MG tablet, TAKE 1 TABLET BY MOUTH EVERY DAY AS NEEDED FOR ALLERGY, Disp: 90 tablet, Rfl: 1   albuterol (PROVENTIL HFA) 108 (90 BASE) MCG/ACT inhaler, Inhale 2 puffs into the lungs every 6 (six) hours as needed for wheezing or shortness of breath ([facility]cue inahaler)., Disp: 1 Inhaler, Rfl: prn   atorvastatin (LIPITOR) 10 MG tablet, Take one tablet by mouth daily Monday through Saturday. Skip Sundays., Disp: 72 tablet, Rfl: 1   hydrocortisone (ANUSOL-HC) 25 MG suppository, Place 1 suppository (25 mg total) rectally 2 (two) times daily., Disp: 12 suppository, Rfl: 0   valsartan-hydrochlorothiazide (DIOVAN-HCT) 160-12.5 MG tablet, Take 1 tablet by mouth daily., Disp: 90 tablet, Rfl: 2   Allergies  Allergen Reactions   Aspirin     REACTION: wheeze   Sulfonamide Derivatives      Review of Systems  Constitutional: Negative.   HENT:  Positive for congestion and postnasal drip.   Eyes: Negative.  Negative for blurred vision.  Respiratory: Negative.  Negative for shortness of breath.   Cardiovascular: Negative.  Negative for chest pain and palpitations.  Gastrointestinal: Negative.   Endocrine: Negative.   Genitourinary: Negative.   Musculoskeletal: Negative.   Skin: Negative.   Allergic/Immunologic: Negative.   Neurological: Negative.   Hematological: Negative.   Psychiatric/Behavioral: Negative.      Today's Vitals   07/18/21 1050 07/18/21 1209  BP: 138/78 130/70  Pulse: 60   Temp: 98.1 F (36.7 C)   TempSrc: Oral   Weight: 179 lb 6.4 oz (81.4 kg)  Height: 5' 0.4" (1.534 m)    Body mass index is 34.57 kg/m.  Wt Readings from Last 3 Encounters:  07/18/21 179 lb 6.4 oz (81.4 kg)  04/20/21 178 lb 12.7 oz (81.1 kg)  04/20/21 178 lb 12.8 oz (81.1 kg)    Objective:  Physical Exam Vitals and nursing note reviewed.  Constitutional:      Appearance: Normal appearance. She is obese.  HENT:     Head: Normocephalic and atraumatic.     Right Ear:  Tympanic membrane, ear canal and external ear normal.     Left Ear: Tympanic membrane, ear canal and external ear normal.     Ears:     Comments: Wears b/l hearing aids    Nose:     Comments: Masked     Mouth/Throat:     Comments: Masked  Eyes:     Extraocular Movements: Extraocular movements intact.     Conjunctiva/sclera: Conjunctivae normal.     Pupils: Pupils are equal, round, and reactive to light.  Cardiovascular:     Rate and Rhythm: Normal rate and regular rhythm.     Pulses: Normal pulses.     Heart sounds: Normal heart sounds.  Pulmonary:     Effort: Pulmonary effort is normal.     Breath sounds: Normal breath sounds.  Chest:  Breasts:    Tanner Score is 5.     Right: Normal.     Left: Normal.  Abdominal:     General: Bowel sounds are normal.     Palpations: Abdomen is soft.  Genitourinary:    Comments: deferred Musculoskeletal:        General: Normal range of motion.     Cervical back: Normal range of motion and neck supple.  Skin:    General: Skin is warm and dry.  Neurological:     General: No focal deficit present.     Mental Status: She is alert and oriented to person, place, and time.  Psychiatric:        Mood and Affect: Mood normal.        Behavior: Behavior normal.        Assessment And Plan:     1. Encounter for annual physical exam Comments: A full exam was performed. She is encouraged to perform monthly self breast exams was discussed with the patient. PATIENT IS ADVISED TO GET 30-45 MINUTES REGULAR EXERCISE NO LESS THAN FOUR TO FIVE DAYS PER WEEK - BOTH WEIGHTBEARING EXERCISES AND AEROBIC ARE RECOMMENDED.  PATIENT IS ADVISED TO FOLLOW A HEALTHY DIET WITH AT LEAST SIX FRUITS/VEGGIES PER DAY, DECREASE INTAKE OF RED MEAT, AND TO INCREASE FISH INTAKE TO TWO DAYS PER WEEK.  MEATS/FISH SHOULD NOT BE FRIED, BAKED OR BROILED IS PREFERABLE.  IT IS ALSO IMPORTANT TO CUT BACK ON YOUR SUGAR INTAKE. PLEASE AVOID ANYTHING WITH ADDED SUGAR, CORN SYRUP OR OTHER  SWEETENERS. IF YOU MUST USE A SWEETENER, YOU CAN TRY STEVIA. IT IS ALSO IMPORTANT TO AVOID ARTIFICIALLY SWEETENERS AND DIET BEVERAGES. LASTLY, I SUGGEST WEARING SPF 50 SUNSCREEN ON EXPOSED PARTS AND ESPECIALLY WHEN IN THE DIRECT SUNLIGHT FOR AN EXTENDED PERIOD OF TIME.  PLEASE AVOID FAST FOOD RESTAURANTS AND INCREASE YOUR WATER INTAKE.  2. Hypertensive nephropathy Comments: Chronic, controlled. No med changes. EKG, NSR w/ nonspecific ST depression, negative T-waves, possible anterolateral  ischemia. No new changes. Encouraged to follow low sodium diet. - POCT Urinalysis Dipstick (81002) - POCT UA - Microalbumin - EKG 12-Lead - CBC - CMP14+EGFR - Lipid panel  3. Stage 3b chronic kidney disease ([place]) Comments: Chronic, encouraged to stay well hydrated and keep BP controlled. Advised to avoid NSAIDs for pain, use Tylenol only. I will schedule renal u/s. - US Renal; Future  4. Sinus congestion Comments: Denies fever, chills and body aches. Advised to avoid milk, cheese, yogurt for two weeks. She will let me know if her sx persist.   5. Abnormal thyroid blood test Comments: I will check thyroid panel today. I will make further recommendations once her labs are available for review.   6. Hearing deficit, bilateral Comments: She wears b/l hearing aids.  7. Class 1 obesity due to excess calories with serious comorbidity and body mass index (BMI) of 34.0 to 34.9 in adult Comments: She is encouraged to strive for BMI less than 30 to decrease cardiac risk. Advised to aim for at least 150 minutes of exercise per week. Advised to perform chair exercises while watching TV.   Patient was given opportunity to ask questions. Patient verbalized understanding of the plan and was able to repeat key elements of the plan. All questions were answered to their satisfaction.   I, Christina Greenland, MD, have reviewed all documentation for this visit. The documentation on 07/30/21 for the exam, diagnosis,  procedures, and orders are all accurate and complete.   IF YOU HAVE BEEN REFERRED TO A SPECIALIST, IT MAY TAKE 1-2 WEEKS TO SCHEDULE/PROCESS THE REFERRAL. IF YOU HAVE NOT HEARD FROM US/SPECIALIST IN TWO WEEKS, PLEASE GIVE [place] A CALL AT [phone].   THE PATIENT IS ENCOURAGED TO PRACTICE SOCIAL DISTANCING DUE TO THE COVID-19 PANDEMIC.

## 2021-07-18 NOTE — Patient Instructions (Signed)

## 2021-07-19 LAB — CBC
Hematocrit: 35.7 % (ref 34.0–46.6)
Hemoglobin: 11.8 g/dL (ref 11.1–15.9)
MCH: 31.7 pg (ref 26.6–33.0)
MCHC: 33.1 g/dL (ref 31.5–35.7)
MCV: 96 fL (ref 79–97)
Platelets: 218 10*3/uL (ref 150–450)
RBC: 3.72 x10E6/uL — ABNORMAL LOW (ref 3.77–5.28)
RDW: 11.7 % (ref 11.7–15.4)
WBC: 5.5 10*3/uL (ref 3.4–10.8)

## 2021-07-19 LAB — CMP14+EGFR
ALT: 9 IU/L (ref 0–32)
AST: 21 IU/L (ref 0–40)
Albumin/Globulin Ratio: 1.3 (ref 1.2–2.2)
Albumin: 4.3 g/dL (ref 3.6–4.6)
Alkaline Phosphatase: 53 IU/L (ref 44–121)
BUN/Creatinine Ratio: 25 (ref 12–28)
BUN: 31 mg/dL — ABNORMAL HIGH (ref 8–27)
Bilirubin Total: 0.5 mg/dL (ref 0.0–1.2)
CO2: 21 mmol/L (ref 20–29)
Calcium: 9.6 mg/dL (ref 8.7–10.3)
Chloride: 104 mmol/L (ref 96–106)
Creatinine, Ser: 1.25 mg/dL — ABNORMAL HIGH (ref 0.57–1.00)
Globulin, Total: 3.2 g/dL (ref 1.5–4.5)
Glucose: 90 mg/dL (ref 70–99)
Potassium: 4.5 mmol/L (ref 3.5–5.2)
Sodium: 140 mmol/L (ref 134–144)
Total Protein: 7.5 g/dL (ref 6.0–8.5)
eGFR: 42 mL/min/{1.73_m2} — ABNORMAL LOW (ref >59)

## 2021-07-19 LAB — LIPID PANEL
Chol/HDL Ratio: 3.2 ratio (ref 0.0–4.4)
Cholesterol, Total: 240 mg/dL — ABNORMAL HIGH (ref 100–199)
HDL: 76 mg/dL (ref >39)
LDL Chol Calc (NIH): 145 mg/dL — ABNORMAL HIGH (ref 0–99)
Triglycerides: 111 mg/dL (ref 0–149)
VLDL Cholesterol Cal: 19 mg/dL (ref 5–40)

## 2021-07-20 ENCOUNTER — Other Ambulatory Visit: Payer: Self-pay | Admitting: Internal Medicine

## 2021-07-25 ENCOUNTER — Other Ambulatory Visit: Payer: Self-pay

## 2021-07-25 MED ORDER — ATORVASTATIN CALCIUM 10 MG PO TABS: ORAL_TABLET | ORAL | 1 refills | Status: DC

## 2021-08-01 ENCOUNTER — Telehealth: Payer: Self-pay

## 2021-08-01 NOTE — Telephone Encounter (Signed)
Lvm    let her know I ordered kidney u/s and to avoid motrin, advil, aleve for pain. can be bad for her kidneys

## 2021-11-21 ENCOUNTER — Ambulatory Visit (INDEPENDENT_AMBULATORY_CARE_PROVIDER_SITE_OTHER): Payer: Medicare Other | Admitting: Internal Medicine

## 2021-11-21 ENCOUNTER — Encounter: Payer: Self-pay | Admitting: Internal Medicine

## 2021-11-21 ENCOUNTER — Ambulatory Visit: Payer: Medicare Other | Admitting: Internal Medicine

## 2021-11-21 VITALS — BP 124/86 | HR 78 | Temp 98.6°F | Ht 60.2 in | Wt 175.0 lb

## 2021-11-21 DIAGNOSIS — Z6830 Body mass index (BMI) 30.0-30.9, adult: Secondary | ICD-10-CM

## 2021-11-21 DIAGNOSIS — Z23 Encounter for immunization: Secondary | ICD-10-CM

## 2021-11-21 DIAGNOSIS — N1832 Chronic kidney disease, stage 3b: Secondary | ICD-10-CM

## 2021-11-21 DIAGNOSIS — R7989 Other specified abnormal findings of blood chemistry: Secondary | ICD-10-CM

## 2021-11-21 DIAGNOSIS — R011 Cardiac murmur, unspecified: Secondary | ICD-10-CM

## 2021-11-21 DIAGNOSIS — E6609 Other obesity due to excess calories: Secondary | ICD-10-CM

## 2021-11-21 DIAGNOSIS — I129 Hypertensive chronic kidney disease with stage 1 through stage 4 chronic kidney disease, or unspecified chronic kidney disease: Secondary | ICD-10-CM | POA: Diagnosis not present

## 2021-11-21 MED ORDER — SHINGRIX 50 MCG/0.5ML IM SUSR: 0.5000 mL | Freq: Once | INTRAMUSCULAR | 0 refills | Status: AC

## 2021-11-21 MED ORDER — DULERA 100-5 MCG/ACT IN AERO: INHALATION_SPRAY | RESPIRATORY_TRACT | 6 refills | Status: DC

## 2021-11-21 MED ORDER — VALSARTAN-HYDROCHLOROTHIAZIDE 160-12.5 MG PO TABS: 1.0000 | ORAL_TABLET | Freq: Every day | ORAL | 2 refills | Status: DC

## 2021-11-21 MED ORDER — TETANUS-DIPHTH-ACELL PERTUSSIS 5-2.5-18.5 LF-MCG/0.5 IM SUSP: 0.5000 mL | Freq: Once | INTRAMUSCULAR | 0 refills | Status: AC

## 2021-11-21 NOTE — Patient Instructions (Signed)

## 2021-11-21 NOTE — Progress Notes (Signed)
?[name],acting as a Education administrator for [name], MD.,have documented all relevant documentation on the behalf of [name], MD,as directed by  [name], MD while in the presence of [name], MD.  ?This visit occurred during the SARS-CoV-2 public health emergency.  Safety protocols were in place, including screening questions prior to the visit, additional usage of staff PPE, and extensive cleaning of exam room while observing appropriate contact time as indicated for disinfecting solutions. ? ?Subjective:  ?  ? Patient ID: Christina Terry , female    DOB: [birth year] , 88 y.o.   MRN: [id number] ? ? ?Chief Complaint  ?Patient presents with  ? Hypertension  ? ? ?HPI ? ?Pt presents today for BP & prediabetes f/u. She reports compliance with meds. Denies headaches,chest pain and shortness of breath. She states she is now taking chair yoga on Tuesdays. She reports she enjoys this class and may go more frequently.  ? ?Hypertension ?This is a chronic problem. The current episode started more than 1 year ago. The problem has been gradually improving since onset. The problem is controlled. Pertinent negatives include no blurred vision, chest pain or palpitations. Past treatments include angiotensin blockers and diuretics. The current treatment provides moderate improvement. Compliance problems include exercise.  Hypertensive end-organ damage includes kidney disease.   ? ?Past Medical History:  ?Diagnosis Date  ? Acute myocardial infarction, unspecified site, episode of care unspecified   ? Unspecified asthma(493.90)   ? Unspecified essential hypertension   ?  ? ?Family History  ?Problem Relation Age of Onset  ? Emphysema Father   ? Alzheimer's disease Father   ? Heart disease Maternal Grandmother   ? Heart Problems Mother   ? Mental illness Mother   ? Alzheimer's disease Mother   ? ? ? ?Current Outpatient Medications:  ?  albuterol (PROVENTIL HFA) 108 (90 BASE) MCG/ACT inhaler, Inhale 2 puffs  into the lungs every 6 (six) hours as needed for wheezing or shortness of breath ([facility] inahaler)., Disp: 1 Inhaler, Rfl: prn ?  atorvastatin (LIPITOR) 10 MG tablet, Take one tablet by mouth daily Monday through Saturday. Skip Sundays., Disp: 72 tablet, Rfl: 1 ?  loratadine (CLARITIN) 10 MG tablet, TAKE 1 TABLET BY MOUTH EVERY DAY AS NEEDED FOR ALLERGY, Disp: 90 tablet, Rfl: 1 ?  Tdap (BOOSTRIX) 5-2.5-18.5 LF-MCG/0.5 injection, Inject 0.5 mLs into the muscle once for 1 dose., Disp: 0.5 mL, Rfl: 0 ?  Zoster Vaccine Adjuvanted [facility]) injection, Inject 0.5 mLs into the muscle once for 1 dose., Disp: 0.5 mL, Rfl: 0 ?  hydrocortisone (ANUSOL-HC) 25 MG suppository, Place 1 suppository (25 mg total) rectally 2 (two) times daily. (Patient not taking: Reported on 11/21/2021), Disp: 12 suppository, Rfl: 0 ?  mometasone-formoterol (DULERA) 100-5 MCG/ACT AERO, TAKE 2 PUFFS BY MOUTH TWICE A DAY, Disp: 13 g, Rfl: 6 ?  valsartan-hydrochlorothiazide (DIOVAN-HCT) 160-12.5 MG tablet, Take 1 tablet by mouth daily., Disp: 90 tablet, Rfl: 2  ? ?Allergies  ?Allergen Reactions  ? Aspirin   ?  REACTION: wheeze  ? Sulfonamide Derivatives   ?  ? ?Review of Systems  ?Constitutional: Negative.   ?Eyes:  Negative for blurred vision.  ?Respiratory: Negative.    ?Cardiovascular: Negative.  Negative for chest pain and palpitations.  ?Neurological: Negative.   ?Psychiatric/Behavioral: Negative.     ? ?Today's Vitals  ? 11/21/21 1405  ?BP: 124/86  ?Pulse: 78  ?Temp: 98.6 ?F (37 ?C)  ?Weight: 175 lb (79.4 kg)  ?Height: 5'  0.2" (1.529 m)  ?PainSc: 0-No pain  ? ?Body mass index is 33.95 kg/m?.  ? ?Wt Readings from Last 3 Encounters:  ?11/21/21 175 lb (79.4 kg)  ?07/18/21 179 lb 6.4 oz (81.4 kg)  ?04/20/21 178 lb 12.7 oz (81.1 kg)  ? ? ? ?Objective:  ?Physical Exam ?Vitals and nursing note reviewed.  ?Constitutional:   ?   Appearance: Normal appearance. She is obese.  ?HENT:  ?   Head: Normocephalic and atraumatic.  ?   Nose:  ?   Comments: Masked  ?    Mouth/Throat:  ?   Comments: Masked  ?Eyes:  ?   Extraocular Movements: Extraocular movements intact.  ?Cardiovascular:  ?   Rate and Rhythm: Normal rate and regular rhythm.  ?   Heart sounds: Murmur heard.  ?Pulmonary:  ?   Effort: Pulmonary effort is normal.  ?   Breath sounds: Normal breath sounds.  ?Musculoskeletal:  ?   Cervical back: Normal range of motion.  ?Skin: ?   General: Skin is warm.  ?Neurological:  ?   General: No focal deficit present.  ?   Mental Status: She is alert.  ?Psychiatric:     ?   Mood and Affect: Mood normal.     ?   Behavior: Behavior normal.  ?   ?Assessment And Plan:  ?   ?1. Hypertensive nephropathy ?Comments: Chronic, well controlled. NO med changes. She is encouraged to follow low sodium diet.  I will check renal function.  She will f/u in 4-6 months.  ?- CMP14+EGFR ? ?2. Stage 3b chronic kidney disease ([place]) ?Comments: Chronic, I will check Renal labs as below. She does not wish to have a referral to Renal at this time. Advised to avoid NSAIDs and keep BP controlled.  ?- Protein electrophoresis, serum ?- Parathyroid Hormone, Intact w/Ca ?- Phosphorus ? ?3. Abnormal thyroid blood test ?Comments: I will check thyroid panel. She was referred to Endo last year; however, she never scheduled appt.  ?- TSH + free T4 ? ?4. Heart murmur ?Comments: I will refer her for 2d-echocardiogram.  ?- ECHOCARDIOGRAM COMPLETE; Future ? ?5. Class 1 obesity due to excess calories with serious comorbidity and body mass index (BMI) of 30.0 to 30.9 in adult ?Comments: Her BMI is acceptable for her demographic. She is encouraged to sign up for two more exercise classes, she agrees to look into this.  ? ?6. Immunization due ?Comments: [facility] will not allow her to receive Shingrix here. I will send rx to the pharmacy.  ? ?Patient was given opportunity to ask questions. Patient verbalized understanding of the plan and was able to repeat key elements of the plan. All questions were answered to their  satisfaction.  ? ?I, [name], MD, have reviewed all documentation for this visit. The documentation on 11/21/21 for the exam, diagnosis, procedures, and orders are all accurate and complete.  ? ?IF YOU HAVE BEEN REFERRED TO A SPECIALIST, IT MAY TAKE 1-2 WEEKS TO SCHEDULE/PROCESS THE REFERRAL. IF YOU HAVE NOT HEARD FROM US/SPECIALIST IN TWO WEEKS, PLEASE GIVE [place] A CALL AT [phone].  ? ?THE PATIENT IS ENCOURAGED TO PRACTICE SOCIAL DISTANCING DUE TO THE COVID-19 PANDEMIC.   ?

## 2021-11-22 LAB — PTH, INTACT AND CALCIUM
Calcium: 9.5 mg/dL (ref 8.7–10.3)
PTH: 41 pg/mL (ref 15–65)

## 2021-11-22 LAB — CMP14+EGFR
ALT: 8 IU/L (ref 0–32)
AST: 24 IU/L (ref 0–40)
Albumin/Globulin Ratio: 1.3 (ref 1.2–2.2)
Albumin: 4.3 g/dL (ref 3.6–4.6)
Alkaline Phosphatase: 57 IU/L (ref 44–121)
BUN/Creatinine Ratio: 23 (ref 12–28)
BUN: 29 mg/dL — ABNORMAL HIGH (ref 8–27)
Bilirubin Total: 0.4 mg/dL (ref 0.0–1.2)
CO2: 22 mmol/L (ref 20–29)
Calcium: 9.7 mg/dL (ref 8.7–10.3)
Chloride: 103 mmol/L (ref 96–106)
Creatinine, Ser: 1.24 mg/dL — ABNORMAL HIGH (ref 0.57–1.00)
Globulin, Total: 3.2 g/dL (ref 1.5–4.5)
Glucose: 79 mg/dL (ref 70–99)
Potassium: 4.8 mmol/L (ref 3.5–5.2)
Sodium: 140 mmol/L (ref 134–144)
Total Protein: 7.5 g/dL (ref 6.0–8.5)
eGFR: 42 mL/min/{1.73_m2} — ABNORMAL LOW (ref >59)

## 2021-11-22 LAB — PROTEIN ELECTROPHORESIS, SERUM
A/G Ratio: 1 (ref 0.7–1.7)
Albumin ELP: 3.7 g/dL (ref 2.9–4.4)
Alpha 1: 0.2 g/dL (ref 0.0–0.4)
Alpha 2: 0.7 g/dL (ref 0.4–1.0)
Beta: 1 g/dL (ref 0.7–1.3)
Gamma Globulin: 1.8 g/dL (ref 0.4–1.8)
Globulin, Total: 3.7 g/dL (ref 2.2–3.9)
Total Protein: 7.4 g/dL (ref 6.0–8.5)

## 2021-11-22 LAB — PHOSPHORUS: Phosphorus: 3 mg/dL (ref 3.0–4.3)

## 2021-11-22 LAB — TSH+FREE T4
Free T4: 1.32 ng/dL (ref 0.82–1.77)
TSH: 0.46 u[IU]/mL (ref 0.450–4.500)

## 2022-01-03 ENCOUNTER — Telehealth: Payer: Self-pay

## 2022-01-03 NOTE — Telephone Encounter (Signed)
Called pt, provider wanted to check on pt to see if she was still having SOB. Pt reports she is fine. ?

## 2022-01-04 NOTE — Telephone Encounter (Signed)
Chmg-error.  

## 2022-03-25 ENCOUNTER — Other Ambulatory Visit: Payer: Self-pay | Admitting: Internal Medicine

## 2022-05-03 ENCOUNTER — Encounter: Payer: Self-pay | Admitting: Internal Medicine

## 2022-05-03 ENCOUNTER — Ambulatory Visit (INDEPENDENT_AMBULATORY_CARE_PROVIDER_SITE_OTHER): Payer: Medicare Other | Admitting: Internal Medicine

## 2022-05-03 ENCOUNTER — Ambulatory Visit
Admission: RE | Admit: 2022-05-03 | Discharge: 2022-05-03 | Disposition: A | Discharge status: Home / Self Care | Payer: Medicare Other | Source: Ambulatory Visit | Attending: Internal Medicine | Admitting: Internal Medicine

## 2022-05-03 ENCOUNTER — Ambulatory Visit: Payer: Medicare Other | Admitting: Internal Medicine

## 2022-05-03 ENCOUNTER — Ambulatory Visit (INDEPENDENT_AMBULATORY_CARE_PROVIDER_SITE_OTHER): Payer: Medicare Other

## 2022-05-03 VITALS — BP 132/80 | HR 92 | Temp 98.4°F | Ht 60.2 in | Wt 176.4 lb

## 2022-05-03 VITALS — Wt 180.0 lb

## 2022-05-03 DIAGNOSIS — N1832 Chronic kidney disease, stage 3b: Secondary | ICD-10-CM

## 2022-05-03 DIAGNOSIS — N2889 Other specified disorders of kidney and ureter: Secondary | ICD-10-CM | POA: Diagnosis not present

## 2022-05-03 DIAGNOSIS — H6122 Impacted cerumen, left ear: Secondary | ICD-10-CM

## 2022-05-03 DIAGNOSIS — Z0000 Encounter for general adult medical examination without abnormal findings: Secondary | ICD-10-CM | POA: Diagnosis not present

## 2022-05-03 DIAGNOSIS — Z23 Encounter for immunization: Secondary | ICD-10-CM | POA: Diagnosis not present

## 2022-05-03 DIAGNOSIS — R160 Hepatomegaly, not elsewhere classified: Secondary | ICD-10-CM | POA: Diagnosis not present

## 2022-05-03 DIAGNOSIS — E6609 Other obesity due to excess calories: Secondary | ICD-10-CM

## 2022-05-03 DIAGNOSIS — Z6834 Body mass index (BMI) 34.0-34.9, adult: Secondary | ICD-10-CM

## 2022-05-03 DIAGNOSIS — I129 Hypertensive chronic kidney disease with stage 1 through stage 4 chronic kidney disease, or unspecified chronic kidney disease: Secondary | ICD-10-CM

## 2022-05-03 DIAGNOSIS — N189 Chronic kidney disease, unspecified: Secondary | ICD-10-CM | POA: Diagnosis not present

## 2022-05-03 DIAGNOSIS — R7309 Other abnormal glucose: Secondary | ICD-10-CM

## 2022-05-03 MED ORDER — VALSARTAN-HYDROCHLOROTHIAZIDE 160-12.5 MG PO TABS: 1.0000 | ORAL_TABLET | Freq: Every day | ORAL | 2 refills | Status: DC

## 2022-05-03 MED ORDER — LORATADINE 10 MG PO TABS: ORAL_TABLET | ORAL | 1 refills | Status: DC

## 2022-05-03 MED ORDER — DULERA 100-5 MCG/ACT IN AERO: INHALATION_SPRAY | RESPIRATORY_TRACT | 6 refills | Status: DC

## 2022-05-03 MED ORDER — ALBUTEROL SULFATE HFA 108 (90 BASE) MCG/ACT IN AERS: 2.0000 | INHALATION_SPRAY | Freq: Four times a day (QID) | RESPIRATORY_TRACT | 99 refills | Status: AC | PRN

## 2022-05-03 NOTE — Progress Notes (Signed)
[name],acting as a Education administrator for [name], MD.,have documented all relevant documentation on the behalf of [name], MD,as directed by  [name], MD while in the presence of [name], MD.    Subjective:     Patient ID: Christina Terry , female    DOB: [birth year] , 88 y.o.   MRN: [id number]   Chief Complaint  Patient presents with   Hypertension    HPI  Pt presents today for BP & prediabetes f/u. She reports compliance with meds. Denies headaches,chest pain and shortness of breath.  She states she feels like she gets around ok. Has good appetite, sleeps well. Her breathing has been fine, reports compliance with inhaler.   Hypertension This is a chronic problem. The current episode started more than 1 year ago. The problem has been gradually improving since onset. The problem is controlled. Pertinent negatives include no blurred vision. Past treatments include angiotensin blockers and diuretics. The current treatment provides moderate improvement. Compliance problems include exercise.  Hypertensive end-organ damage includes kidney disease.     Past Medical History:  Diagnosis Date   Acute myocardial infarction, unspecified site, episode of care unspecified    Unspecified asthma(493.90)    Unspecified essential hypertension      Family History  Problem Relation Age of Onset   Emphysema Father    Alzheimer's disease Father    Heart disease Maternal Grandmother    Heart Problems Mother    Mental illness Mother    Alzheimer's disease Mother      Current Outpatient Medications:    atorvastatin (LIPITOR) 10 MG tablet, TAKE ONE TABLET BY MOUTH DAILY MONDAY THROUGH SATURDAY. SKIP SUNDAYS., Disp: 72 tablet, Rfl: 1   albuterol (PROVENTIL HFA) 108 (90 Base) MCG/ACT inhaler, Inhale 2 puffs into the lungs every 6 (six) hours as needed for wheezing or shortness of breath (Rescue inahaler)., Disp: 1 each, Rfl: prn   hydrocortisone (ANUSOL-HC) 25 MG  suppository, Place 1 suppository (25 mg total) rectally 2 (two) times daily. (Patient not taking: Reported on 11/21/2021), Disp: 12 suppository, Rfl: 0   loratadine (CLARITIN) 10 MG tablet, TAKE 1 TABLET BY MOUTH EVERY DAY AS NEEDED FOR ALLERGY, Disp: 90 tablet, Rfl: 1   mometasone-formoterol (DULERA) 100-5 MCG/ACT AERO, TAKE 2 PUFFS BY MOUTH TWICE A DAY, Disp: 13 g, Rfl: 6   valsartan-hydrochlorothiazide (DIOVAN-HCT) 160-12.5 MG tablet, Take 1 tablet by mouth daily., Disp: 90 tablet, Rfl: 2   Allergies  Allergen Reactions   Aspirin     REACTION: wheeze   Sulfonamide Derivatives      Review of Systems  Constitutional: Negative.   Eyes: Negative.  Negative for blurred vision.  Respiratory: Negative.    Cardiovascular: Negative.   Musculoskeletal: Negative.   Skin: Negative.   Neurological: Negative.   Psychiatric/Behavioral: Negative.       Today's Vitals   05/03/22 1401  BP: 132/80  Pulse: 92  Temp: 98.4 F (36.9 C)  Weight: 176 lb 6.4 oz (80 kg)  Height: 5' 0.2" (1.529 m)  PainSc: 0-No pain   Body mass index is 34.22 kg/m.  Wt Readings from Last 3 Encounters:  05/03/22 176 lb 6.4 oz (80 kg)  05/03/22 180 lb (81.6 kg)  11/21/21 175 lb (79.4 kg)     Objective:  Physical Exam Vitals and nursing note reviewed.  Constitutional:      Appearance: Normal appearance.  HENT:     Head: Normocephalic and atraumatic.     Right  Ear: Tympanic membrane, ear canal and external ear normal. There is no impacted cerumen.     Left Ear: Ear canal and external ear normal. There is impacted cerumen.  Eyes:     Extraocular Movements: Extraocular movements intact.  Cardiovascular:     Rate and Rhythm: Normal rate and regular rhythm.     Heart sounds: Murmur heard.  Pulmonary:     Effort: Pulmonary effort is normal.     Breath sounds: Normal breath sounds.  Musculoskeletal:     Cervical back: Normal range of motion.  Skin:    General: Skin is warm.  Neurological:     General: No  focal deficit present.     Mental Status: She is alert.  Psychiatric:        Mood and Affect: Mood normal.        Behavior: Behavior normal.      Assessment And Plan:     1. Hypertensive nephropathy Comments: Chronic, fair control. Goal BP<130/80.  She will c/w valsartan/hct 160/12.12m daily.  I will check renal function today. She will f/u in 4-6 months.  - CMP14+EGFR - CBC no Diff - Insulin, random(561)  2. Stage 3b chronic kidney disease ([place] Comments: Chronic, she is reminded to avoid NSAIDs, stay hydrated and keep BP well controlled to decrease risk of CKD progression. I will check renal u/s.  - [place]; Future - Protein electrophoresis, serum - Parathyroid Hormone, Intact w/Ca - Phosphorus  3. Left ear impacted cerumen Comments: After obtaining verbal consent, left ear was flushed by irrigation without any complications. No TM abnormalities were noted.  - Ear Lavage  4. Class 1 obesity due to excess calories with serious comorbidity and body mass index (BMI) of 34.0 to 34.9 in adult Comments: She is encouraged to aim for at least 150 minutes of exercise per week, while striving for BMI<30 to decrease cardiac risk.   5. Immunization due - Flu Vaccine QUAD High Dose(Fluad)   Patient was given opportunity to ask questions. Patient verbalized understanding of the plan and was able to repeat key elements of the plan. All questions were answered to their satisfaction.   I, RMaximino Greenland MD, have reviewed all documentation for this visit. The documentation on 05/03/22 for the exam, diagnosis, procedures, and orders are all accurate and complete.   IF YOU HAVE BEEN REFERRED TO A SPECIALIST, IT MAY TAKE 1-2 WEEKS TO SCHEDULE/PROCESS THE REFERRAL. IF YOU HAVE NOT HEARD FROM US/SPECIALIST IN TWO WEEKS, PLEASE GIVE [place] CALL AT [phone].   THE PATIENT IS ENCOURAGED TO PRACTICE SOCIAL DISTANCING DUE TO THE COVID-19 PANDEMIC.

## 2022-05-03 NOTE — Progress Notes (Signed)
I connected with Christina Terry today by telephone and verified that I am speaking with the correct person using two identifiers. Location patient: home Location provider: work Persons participating in the virtual visit: Christina Terry, Elisha Ponder LPN.   I discussed the limitations, risks, security and privacy concerns of performing an evaluation and management service by telephone and the availability of in person appointments. I also discussed with the patient that there may be a patient responsible charge related to this service. The patient expressed understanding and verbally consented to this telephonic visit.    Interactive audio and video telecommunications were attempted between this provider and patient, however failed, due to patient having technical difficulties OR patient did not have access to video capability.  We continued and completed visit with audio only.     Vital signs may be patient reported or missing.  Subjective:   Christina Terry is a 88 y.o. female who presents for Medicare Annual (Subsequent) preventive examination.  Review of Systems     Cardiac Risk Factors include: advanced age (>38men, >57 women);hypertension;obesity (BMI >30kg/m2)     Objective:    Today's Vitals   05/03/22 1154  Weight: 180 lb (81.6 kg)   Body mass index is 34.92 kg/m.     05/03/2022   11:59 AM 04/20/2021   10:24 AM 03/04/2020    2:27 PM 02/20/2019    9:39 AM  Advanced Directives  Does Patient Have a Medical Advance Directive? No Yes No Yes  Type of Special educational needs teacher of [place];Living will  Healthcare Power of [place];Living will  Copy of Healthcare Power of Attorney in Chart?  No - copy requested  No - copy requested  Would patient like information on creating a medical advance directive?   No - Patient declined     Current Medications (verified) Outpatient Encounter Medications as of 05/03/2022  Medication Sig   atorvastatin (LIPITOR) 10 MG tablet TAKE  ONE TABLET BY MOUTH DAILY MONDAY THROUGH SATURDAY. SKIP SUNDAYS.   loratadine (CLARITIN) 10 MG tablet TAKE 1 TABLET BY MOUTH EVERY DAY AS NEEDED FOR ALLERGY   mometasone-formoterol (DULERA) 100-5 MCG/ACT AERO TAKE 2 PUFFS BY MOUTH TWICE A DAY   valsartan-hydrochlorothiazide (DIOVAN-HCT) 160-12.5 MG tablet Take 1 tablet by mouth daily.   albuterol (PROVENTIL HFA) 108 (90 BASE) MCG/ACT inhaler Inhale 2 puffs into the lungs every 6 (six) hours as needed for wheezing or shortness of breath (Rescue inahaler).   hydrocortisone (ANUSOL-HC) 25 MG suppository Place 1 suppository (25 mg total) rectally 2 (two) times daily. (Patient not taking: Reported on 11/21/2021)   No facility-administered encounter medications on file as of 05/03/2022.    Allergies (verified) Aspirin and Sulfonamide derivatives   History: Past Medical History:  Diagnosis Date   Acute myocardial infarction, unspecified site, episode of care unspecified    Unspecified asthma(493.90)    Unspecified essential hypertension    Past Surgical History:  Procedure Laterality Date   sinus surgey     TOTAL ABDOMINAL HYSTERECTOMY W/ BILATERAL SALPINGOOPHORECTOMY     Family History  Problem Relation Age of Onset   Emphysema Father    Alzheimer's disease Father    Heart disease Maternal Grandmother    Heart Problems Mother    Mental illness Mother    Alzheimer's disease Mother    Social History   Socioeconomic History   Marital status: Widowed    Spouse name: Not on file   Number of children: Not on file   Years of education: Not  on file   Highest education level: Not on file  Occupational History   Occupation: Cook  Tobacco Use   Smoking status: Never   Smokeless tobacco: Never  Vaping Use   Vaping Use: Never used  Substance and Sexual Activity   Alcohol use: No   Drug use: No   Sexual activity: Not Currently  Other Topics Concern   Not on file  Social History Narrative   Not on file   Social Determinants of  Health   Financial Resource Strain: Low Risk  (05/03/2022)   Overall Financial Resource Strain (CARDIA)    Difficulty of Paying Living Expenses: Not hard at all  Food Insecurity: No Food Insecurity (05/03/2022)   Hunger Vital Sign    Worried About Running Out of Food in the Last Year: Never true    Ran Out of Food in the Last Year: Never true  Transportation Needs: No Transportation Needs (05/03/2022)   PRAPARE - Administrator, Civil Service (Medical): No    Lack of Transportation (Non-Medical): No  Physical Activity: Sufficiently Active (05/03/2022)   Exercise Vital Sign    Days of Exercise per Week: 7 days    Minutes of Exercise per Session: 30 min  Stress: No Stress Concern Present (05/03/2022)   [employer] of Occupational Health - Occupational Stress Questionnaire    Feeling of Stress : Not at all  Social Connections: Not on file    Tobacco Counseling Counseling given: Not Answered   Clinical Intake:  Pre-visit preparation completed: Yes  Pain : No/denies pain     Nutritional Status: BMI > 30  Obese Nutritional Risks: None Diabetes: No  How often do you need to have someone help you when you read instructions, pamphlets, or other written materials from your doctor or pharmacy?: 1 - Never  Diabetic? no  Interpreter Needed?: No  Information entered by :: NAllen LPN   Activities of Daily Living    05/03/2022   12:00 PM  In your present state of health, do you have any difficulty performing the following activities:  Hearing? 1  Comment has hearing aids  Vision? 0  Difficulty concentrating or making decisions? 0  Walking or climbing stairs? 0  Dressing or bathing? 0  Doing errands, shopping? 0  Preparing Food and eating ? N  Using the Toilet? N  In the past six months, have you accidently leaked urine? N  Do you have problems with loss of bowel control? N  Managing your Medications? N  Managing your Finances? N  Housekeeping or managing  your Housekeeping? N    Patient Care Team: Dorothyann Peng, MD as PCP - General (Internal Medicine)  Indicate any recent Medical Services you may have received from other than [facility] providers in the past year (date may be approximate).     Assessment:   This is a routine wellness examination for [place].  Hearing/Vision screen Vision Screening - Comments:: No regular eye exams,  Dietary issues and exercise activities discussed: Current Exercise Habits: Home exercise routine, Type of exercise: calisthenics, Time (Minutes): 30, Frequency (Times/Week): 7, Weekly Exercise (Minutes/Week): 210   Goals Addressed             This Visit's Progress    Patient Stated       05/03/2022, wants to eat healthier       Depression Screen    05/03/2022   12:00 PM 04/20/2021   10:27 AM 03/04/2020    2:29 PM 05/28/2019  11:37 AM 02/20/2019    9:39 AM 02/20/2019    9:24 AM  PHQ 2/9 Scores  PHQ - 2 Score 0 1 0 0 0 0  PHQ- 9 Score   1       Fall Risk    05/03/2022   11:59 AM 04/20/2021   10:26 AM 03/04/2020    2:28 PM 05/28/2019   11:36 AM 02/20/2019    9:39 AM  Fall Risk   Falls in the past year? 0 0 0 0 0  Number falls in past yr: 0      Injury with Fall? 0      Risk for fall due to : Medication side effect Medication side effect Medication side effect  Medication side effect  Follow up Falls prevention discussed;Education provided;Falls evaluation completed Falls evaluation completed;Education provided;Falls prevention discussed Falls evaluation completed;Education provided;Falls prevention discussed  Falls evaluation completed;Education provided;Falls prevention discussed    FALL RISK PREVENTION PERTAINING TO THE HOME:  Any stairs in or around the home? Yes  If so, are there any without handrails? No  Home free of loose throw rugs in walkways, pet beds, electrical cords, etc? Yes  Adequate lighting in your home to reduce risk of falls? Yes   ASSISTIVE DEVICES UTILIZED TO PREVENT  FALLS:  Life alert? No  Use of a cane, walker or w/c? No  Grab bars in the bathroom? No  Shower chair or bench in shower? No  Elevated toilet seat or a handicapped toilet? Yes   TIMED UP AND GO:  Was the test performed? No .      Cognitive Function:        05/03/2022   12:01 PM 04/20/2021   10:29 AM 03/04/2020    2:32 PM 02/20/2019    9:42 AM  6CIT Screen  What Year? 0 points 0 points 0 points 0 points  What month? 0 points 0 points 0 points 0 points  What time? 0 points 0 points 0 points 3 points  Count back from 20 0 points 0 points 0 points 0 points  Months in reverse 2 points 2 points 2 points 2 points  Repeat phrase 6 points 4 points 4 points 0 points  Total Score 8 points 6 points 6 points 5 points    Immunizations Immunization History  Administered Date(s) Administered   Fluad Quad(high Dose 65+) 05/06/2019   Influenza Split 08/02/2011   Influenza Whole 05/19/2009, 05/21/2010   Influenza, High Dose Seasonal PF 06/04/2018, 05/30/2021   Influenza-Unspecified 05/06/2019   Moderna Sars-Covid-2 Vaccination 09/11/2019, 10/17/2019, 08/17/2020, 01/25/2021   Pneumococcal Conjugate-13 06/26/2018   Pneumococcal Polysaccharide-23 05/19/2009, 04/29/2020   Zoster Recombinat (Shingrix) 09/26/2021    TDAP status: Due, Education has been provided regarding the importance of this vaccine. Advised may receive this vaccine at local pharmacy or Health Dept. Aware to provide a copy of the vaccination record if obtained from local pharmacy or Health Dept. Verbalized acceptance and understanding.  Flu Vaccine status: Due, Education has been provided regarding the importance of this vaccine. Advised may receive this vaccine at local pharmacy or Health Dept. Aware to provide a copy of the vaccination record if obtained from local pharmacy or Health Dept. Verbalized acceptance and understanding.  Pneumococcal vaccine status: Up to date  Covid-19 vaccine status: Completed  vaccines  Qualifies for Shingles Vaccine? Yes   Zostavax completed No   Shingrix Completed?: needs second dose  Screening Tests Health Maintenance  Topic Date Due   COVID-19 Vaccine (5 - Moderna  risk series) 03/22/2021   TETANUS/TDAP  09/28/2021   Zoster Vaccines- Shingrix (2 of 2) 11/21/2021   INFLUENZA VACCINE  03/21/2022   Pneumonia Vaccine 22+ Years old  Completed   DEXA SCAN  Completed   HPV VACCINES  Aged Out    Health Maintenance  Health Maintenance Due  Topic Date Due   COVID-19 Vaccine (5 - Moderna risk series) 03/22/2021   TETANUS/TDAP  09/28/2021   Zoster Vaccines- Shingrix (2 of 2) 11/21/2021   INFLUENZA VACCINE  03/21/2022    Colorectal cancer screening: No longer required.   Mammogram status: No longer required due to age.  Bone Density status: Completed 12/08/2011.   Lung Cancer Screening: (Low Dose CT Chest recommended if Age 26-80 years, 30 pack-year currently smoking OR have quit w/in 15years.) does not qualify.   Lung Cancer Screening Referral: no  Additional Screening:  Hepatitis C Screening: does not qualify;   Vision Screening: Recommended annual ophthalmology exams for early detection of glaucoma and other disorders of the eye. Is the patient up to date with their annual eye exam?  No  Who is the provider or what is the name of the office in which the patient attends annual eye exams? none If pt is not established with a provider, would they like to be referred to a provider to establish care? No .   Dental Screening: Recommended annual dental exams for proper oral hygiene  Community Resource Referral / Chronic Care Management: CRR required this visit?  No   CCM required this visit?  No      Plan:     I have personally reviewed and noted the following in the patient's chart:   Medical and social history Use of alcohol, tobacco or illicit drugs  Current medications and supplements including opioid prescriptions. Patient is not  currently taking opioid prescriptions. Functional ability and status Nutritional status Physical activity Advanced directives List of other physicians Hospitalizations, surgeries, and ER visits in previous 12 months Vitals Screenings to include cognitive, depression, and falls Referrals and appointments  In addition, I have reviewed and discussed with patient certain preventive protocols, quality metrics, and best practice recommendations. A written personalized care plan for preventive services as well as general preventive health recommendations were provided to patient.     Barb Merino, LPN   1/76/1607   Nurse Notes: none  Due to this being a virtual visit, the after visit summary with patients personalized plan was offered to patient via mail or my-chart. Patient preferred to pick up at office at next visit

## 2022-05-03 NOTE — Patient Instructions (Signed)
Hypertension, Adult ?Hypertension is another name for high blood pressure. High blood pressure forces your heart to work harder to pump blood. This can cause problems over time. ?There are two numbers in a blood pressure reading. There is a top number (systolic) over a bottom number (diastolic). It is best to have a blood pressure that is below 120/80. ?What are the causes? ?The cause of this condition is not known. Some other conditions can lead to high blood pressure. ?What increases the risk? ?Some lifestyle factors can make you more likely to develop high blood pressure: ?Smoking. ?Not getting enough exercise or physical activity. ?Being overweight. ?Having too much fat, sugar, calories, or salt (sodium) in your diet. ?Drinking too much alcohol. ?Other risk factors include: ?Having any of these conditions: ?Heart disease. ?Diabetes. ?High cholesterol. ?Kidney disease. ?Obstructive sleep apnea. ?Having a family history of high blood pressure and high cholesterol. ?Age. The risk increases with age. ?Stress. ?What are the signs or symptoms? ?High blood pressure may not cause symptoms. Very high blood pressure (hypertensive crisis) may cause: ?Headache. ?Fast or uneven heartbeats (palpitations). ?Shortness of breath. ?Nosebleed. ?Vomiting or feeling like you may vomit (nauseous). ?Changes in how you see. ?Very bad chest pain. ?Feeling dizzy. ?Seizures. ?How is this treated? ?This condition is treated by making healthy lifestyle changes, such as: ?Eating healthy foods. ?Exercising more. ?Drinking less alcohol. ?Your doctor may prescribe medicine if lifestyle changes do not help enough and if: ?Your top number is above 130. ?Your bottom number is above 80. ?Your personal target blood pressure may vary. ?Follow these instructions at home: ?Eating and drinking ? ?If told, follow the DASH eating plan. To follow this plan: ?Fill one half of your plate at each meal with fruits and vegetables. ?Fill one fourth of your plate  at each meal with whole grains. Whole grains include whole-wheat pasta, brown rice, and whole-grain bread. ?Eat or drink low-fat dairy products, such as skim milk or low-fat yogurt. ?Fill one fourth of your plate at each meal with low-fat (lean) proteins. Low-fat proteins include fish, chicken without skin, eggs, beans, and tofu. ?Avoid fatty meat, cured and processed meat, or chicken with skin. ?Avoid pre-made or processed food. ?Limit the amount of salt in your diet to less than 1,500 mg each day. ?Do not drink alcohol if: ?Your doctor tells you not to drink. ?You are pregnant, may be pregnant, or are planning to become pregnant. ?If you drink alcohol: ?Limit how much you have to: ?0-1 drink a day for women. ?0-2 drinks a day for men. ?Know how much alcohol is in your drink. In the [place] one drink equals one 12 oz bottle of beer (355 mL), one 5 oz glass of wine (148 mL), or one 1? oz glass of hard liquor (44 mL). ?Lifestyle ? ?Work with your doctor to stay at a healthy weight or to lose weight. Ask your doctor what the best weight is for you. ?Get at least 30 minutes of exercise that causes your heart to beat faster (aerobic exercise) most days of the week. This may include walking, swimming, or biking. ?Get at least 30 minutes of exercise that strengthens your muscles (resistance exercise) at least 3 days a week. This may include lifting weights or doing Pilates. ?Do not smoke or use any products that contain nicotine or tobacco. If you need help quitting, ask your doctor. ?Check your blood pressure at home as told by your doctor. ?Keep all follow-up visits. ?Medicines ?Take over-the-counter and prescription medicines   only as told by your doctor. Follow directions carefully. ?Do not skip doses of blood pressure medicine. The medicine does not work as well if you skip doses. Skipping doses also puts you at risk for problems. ?Ask your doctor about side effects or reactions to medicines that you should watch  for. ?Contact a doctor if: ?You think you are having a reaction to the medicine you are taking. ?You have headaches that keep coming back. ?You feel dizzy. ?You have swelling in your ankles. ?You have trouble with your vision. ?Get help right away if: ?You get a very bad headache. ?You start to feel mixed up (confused). ?You feel weak or numb. ?You feel faint. ?You have very bad pain in your: ?Chest. ?Belly (abdomen). ?You vomit more than once. ?You have trouble breathing. ?These symptoms may be an emergency. Get help right away. Call 911. ?Do not wait to see if the symptoms will go away. ?Do not drive yourself to the hospital. ?Summary ?Hypertension is another name for high blood pressure. ?High blood pressure forces your heart to work harder to pump blood. ?For most people, a normal blood pressure is less than 120/80. ?Making healthy choices can help lower blood pressure. If your blood pressure does not get lower with healthy choices, you may need to take medicine. ?This information is not intended to replace advice given to you by your health care provider. Make sure you discuss any questions you have with your health care provider. ?Document Revised: 05/26/2021 Document Reviewed: 05/26/2021 ?Elsevier Patient Education ? 2023 [employer] ? ?

## 2022-05-03 NOTE — Patient Instructions (Signed)
[name] , Thank you for taking time to come for your Medicare Wellness Visit. I appreciate your ongoing commitment to your health goals. Please review the following plan we discussed and let me know if I can assist you in the future.   Screening recommendations/referrals: Colonoscopy: not required Mammogram: not required Bone Density: completed 12/08/2011 Recommended yearly ophthalmology/optometry visit for glaucoma screening and checkup Recommended yearly dental visit for hygiene and checkup  Vaccinations: Influenza vaccine: due Pneumococcal vaccine: completed 04/29/2020 Tdap vaccine: due Shingles vaccine: needs second dose   Covid-19: 01/25/2021, 08/17/2020, 10/17/2019, 09/11/2019  Advanced directives: Advance directive discussed with you today. Even though you declined this today please call our office should you change your mind and we can give you the proper paperwork for you to fill out.  Conditions/risks identified: none  Next appointment: Follow up in one year for your annual wellness visit    Preventive Care 65 Years and Older, Female Preventive care refers to lifestyle choices and visits with your health care provider that can promote health and wellness. What does preventive care include? A yearly physical exam. This is also called an annual well check. Dental exams once or twice a year. Routine eye exams. Ask your health care provider how often you should have your eyes checked. Personal lifestyle choices, including: Daily care of your teeth and gums. Regular physical activity. Eating a healthy diet. Avoiding tobacco and drug use. Limiting alcohol use. Practicing safe sex. Taking low-dose aspirin every day. Taking vitamin and mineral supplements as recommended by your health care provider. What happens during an annual well check? The services and screenings done by your health care provider during your annual well check will depend on your age, overall health,  lifestyle risk factors, and family history of disease. Counseling  Your health care provider may ask you questions about your: Alcohol use. Tobacco use. Drug use. Emotional well-being. Home and relationship well-being. Sexual activity. Eating habits. History of falls. Memory and ability to understand (cognition). Work and work Astronomer. Reproductive health. Screening  You may have the following tests or measurements: Height, weight, and BMI. Blood pressure. Lipid and cholesterol levels. These may be checked every 5 years, or more frequently if you are over 8 years old. Skin check. Lung cancer screening. You may have this screening every year starting at age 19 if you have a 30-pack-year history of smoking and currently smoke or have quit within the past 15 years. Fecal occult blood test (FOBT) of the stool. You may have this test every year starting at age 22. Flexible sigmoidoscopy or colonoscopy. You may have a sigmoidoscopy every 5 years or a colonoscopy every 10 years starting at age 52. Hepatitis C blood test. Hepatitis B blood test. Sexually transmitted disease (STD) testing. Diabetes screening. This is done by checking your blood sugar (glucose) after you have not eaten for a while (fasting). You may have this done every 1-3 years. Bone density scan. This is done to screen for osteoporosis. You may have this done starting at age 64. Mammogram. This may be done every 1-2 years. Talk to your health care provider about how often you should have regular mammograms. Talk with your health care provider about your test results, treatment options, and if necessary, the need for more tests. Vaccines  Your health care provider may recommend certain vaccines, such as: Influenza vaccine. This is recommended every year. Tetanus, diphtheria, and acellular pertussis (Tdap, Td) vaccine. You may need a Td booster every 10 years. Zoster  vaccine. You may need this after age  50. Pneumococcal 13-valent conjugate (PCV13) vaccine. One dose is recommended after age 83. Pneumococcal polysaccharide (PPSV23) vaccine. One dose is recommended after age 54. Talk to your health care provider about which screenings and vaccines you need and how often you need them. This information is not intended to replace advice given to you by your health care provider. Make sure you discuss any questions you have with your health care provider. Document Released: 09/03/2015 Document Revised: 04/26/2016 Document Reviewed: 06/08/2015 Elsevier Interactive Patient Education  2017 [place] Prevention in the Home Falls can cause injuries. They can happen to people of all ages. There are many things you can do to make your home safe and to help prevent falls. What can I do on the outside of my home? Regularly fix the edges of walkways and driveways and fix any cracks. Remove anything that might make you trip as you walk through a door, such as a raised step or threshold. Trim any bushes or trees on the path to your home. Use bright outdoor lighting. Clear any walking paths of anything that might make someone trip, such as rocks or tools. Regularly check to see if handrails are loose or broken. Make sure that both sides of any steps have handrails. Any raised decks and porches should have guardrails on the edges. Have any leaves, snow, or ice cleared regularly. Use sand or salt on walking paths during winter. Clean up any spills in your garage right away. This includes oil or grease spills. What can I do in the bathroom? Use night lights. Install grab bars by the toilet and in the tub and shower. Do not use towel bars as grab bars. Use non-skid mats or decals in the tub or shower. If you need to sit down in the shower, use a plastic, non-slip stool. Keep the floor dry. Clean up any water that spills on the floor as soon as it happens. Remove soap buildup in the tub or shower  regularly. Attach bath mats securely with double-sided non-slip rug tape. Do not have throw rugs and other things on the floor that can make you trip. What can I do in the bedroom? Use night lights. Make sure that you have a light by your bed that is easy to reach. Do not use any sheets or blankets that are too big for your bed. They should not hang down onto the floor. Have a firm chair that has side arms. You can use this for support while you get dressed. Do not have throw rugs and other things on the floor that can make you trip. What can I do in the kitchen? Clean up any spills right away. Avoid walking on wet floors. Keep items that you use a lot in easy-to-reach places. If you need to reach something above you, use a strong step stool that has a grab bar. Keep electrical cords out of the way. Do not use floor polish or wax that makes floors slippery. If you must use wax, use non-skid floor wax. Do not have throw rugs and other things on the floor that can make you trip. What can I do with my stairs? Do not leave any items on the stairs. Make sure that there are handrails on both sides of the stairs and use them. Fix handrails that are broken or loose. Make sure that handrails are as long as the stairways. Check any carpeting to make sure that it  is firmly attached to the stairs. Fix any carpet that is loose or worn. Avoid having throw rugs at the top or bottom of the stairs. If you do have throw rugs, attach them to the floor with carpet tape. Make sure that you have a light switch at the top of the stairs and the bottom of the stairs. If you do not have them, ask someone to add them for you. What else can I do to help prevent falls? Wear shoes that: Do not have high heels. Have rubber bottoms. Are comfortable and fit you well. Are closed at the toe. Do not wear sandals. If you use a stepladder: Make sure that it is fully opened. Do not climb a closed stepladder. Make sure that  both sides of the stepladder are locked into place. Ask someone to hold it for you, if possible. Clearly mark and make sure that you can see: Any grab bars or handrails. First and last steps. Where the edge of each step is. Use tools that help you move around (mobility aids) if they are needed. These include: Canes. Walkers. Scooters. Crutches. Turn on the lights when you go into a dark area. Replace any light bulbs as soon as they burn out. Set up your furniture so you have a clear path. Avoid moving your furniture around. If any of your floors are uneven, fix them. If there are any pets around you, be aware of where they are. Review your medicines with your doctor. Some medicines can make you feel dizzy. This can increase your chance of falling. Ask your doctor what other things that you can do to help prevent falls. This information is not intended to replace advice given to you by your health care provider. Make sure you discuss any questions you have with your health care provider. Document Released: 06/03/2009 Document Revised: 01/13/2016 Document Reviewed: 09/11/2014 Elsevier Interactive Patient Education  2017 [employer].

## 2022-05-08 LAB — CBC
Hematocrit: 36.6 % (ref 34.0–46.6)
Hemoglobin: 12.1 g/dL (ref 11.1–15.9)
MCH: 32 pg (ref 26.6–33.0)
MCHC: 33.1 g/dL (ref 31.5–35.7)
MCV: 97 fL (ref 79–97)
Platelets: 218 10*3/uL (ref 150–450)
RBC: 3.78 x10E6/uL (ref 3.77–5.28)
RDW: 11.8 % (ref 11.7–15.4)
WBC: 6.9 10*3/uL (ref 3.4–10.8)

## 2022-05-08 LAB — PROTEIN ELECTROPHORESIS, SERUM
A/G Ratio: 1.1 (ref 0.7–1.7)
Albumin ELP: 4 g/dL (ref 2.9–4.4)
Alpha 1: 0.2 g/dL (ref 0.0–0.4)
Alpha 2: 0.6 g/dL (ref 0.4–1.0)
Beta: 1 g/dL (ref 0.7–1.3)
Gamma Globulin: 1.9 g/dL — ABNORMAL HIGH (ref 0.4–1.8)
Globulin, Total: 3.7 g/dL (ref 2.2–3.9)

## 2022-05-08 LAB — PTH, INTACT AND CALCIUM: PTH: 37 pg/mL (ref 15–65)

## 2022-05-08 LAB — CMP14+EGFR
ALT: 8 IU/L (ref 0–32)
AST: 18 IU/L (ref 0–40)
Albumin/Globulin Ratio: 1.2 (ref 1.2–2.2)
Albumin: 4.2 g/dL (ref 3.7–4.7)
Alkaline Phosphatase: 56 IU/L (ref 44–121)
BUN/Creatinine Ratio: 18 (ref 12–28)
BUN: 24 mg/dL (ref 8–27)
Bilirubin Total: 0.4 mg/dL (ref 0.0–1.2)
CO2: 25 mmol/L (ref 20–29)
Calcium: 9.5 mg/dL (ref 8.7–10.3)
Chloride: 103 mmol/L (ref 96–106)
Creatinine, Ser: 1.33 mg/dL — ABNORMAL HIGH (ref 0.57–1.00)
Globulin, Total: 3.5 g/dL (ref 1.5–4.5)
Glucose: 93 mg/dL (ref 70–99)
Potassium: 4.9 mmol/L (ref 3.5–5.2)
Sodium: 141 mmol/L (ref 134–144)
Total Protein: 7.7 g/dL (ref 6.0–8.5)
eGFR: 38 mL/min/{1.73_m2} — ABNORMAL LOW (ref >59)

## 2022-05-08 LAB — INSULIN, RANDOM: INSULIN: 10.8 u[IU]/mL (ref 2.6–24.9)

## 2022-05-08 LAB — PHOSPHORUS: Phosphorus: 3.2 mg/dL (ref 3.0–4.3)

## 2022-05-11 ENCOUNTER — Other Ambulatory Visit: Payer: Self-pay | Admitting: Internal Medicine

## 2022-05-11 DIAGNOSIS — N2889 Other specified disorders of kidney and ureter: Secondary | ICD-10-CM

## 2022-05-13 ENCOUNTER — Other Ambulatory Visit: Payer: Self-pay | Admitting: Internal Medicine

## 2022-05-13 DIAGNOSIS — N1832 Chronic kidney disease, stage 3b: Secondary | ICD-10-CM

## 2022-05-14 DIAGNOSIS — H6122 Impacted cerumen, left ear: Secondary | ICD-10-CM | POA: Insufficient documentation

## 2022-05-17 ENCOUNTER — Other Ambulatory Visit: Payer: Self-pay

## 2022-05-17 DIAGNOSIS — N1832 Chronic kidney disease, stage 3b: Secondary | ICD-10-CM

## 2022-05-23 ENCOUNTER — Ambulatory Visit: Payer: Medicare Other

## 2022-05-31 ENCOUNTER — Ambulatory Visit
Admission: RE | Admit: 2022-05-31 | Discharge: 2022-05-31 | Disposition: A | Discharge status: Home / Self Care | Payer: Medicare Other | Source: Ambulatory Visit | Attending: Internal Medicine | Admitting: Internal Medicine

## 2022-05-31 DIAGNOSIS — N2889 Other specified disorders of kidney and ureter: Secondary | ICD-10-CM

## 2022-05-31 DIAGNOSIS — N281 Cyst of kidney, acquired: Secondary | ICD-10-CM | POA: Diagnosis not present

## 2022-05-31 MED ORDER — GADOPICLENOL 0.5 MMOL/ML IV SOLN
8.0000 mL | Freq: Once | INTRAVENOUS | Status: AC | PRN
  Administered 2022-05-31: 8 mL via INTRAVENOUS

## 2022-08-03 ENCOUNTER — Encounter: Payer: Self-pay | Admitting: Internal Medicine

## 2022-08-03 ENCOUNTER — Ambulatory Visit (INDEPENDENT_AMBULATORY_CARE_PROVIDER_SITE_OTHER): Payer: Medicare Other | Admitting: Internal Medicine

## 2022-08-03 VITALS — BP 160/90 | HR 98 | Temp 97.7°F | Ht 60.4 in | Wt 172.4 lb

## 2022-08-03 DIAGNOSIS — Z0000 Encounter for general adult medical examination without abnormal findings: Secondary | ICD-10-CM

## 2022-08-03 DIAGNOSIS — R9431 Abnormal electrocardiogram [ECG] [EKG]: Secondary | ICD-10-CM

## 2022-08-03 DIAGNOSIS — Z6833 Body mass index (BMI) 33.0-33.9, adult: Secondary | ICD-10-CM

## 2022-08-03 DIAGNOSIS — I129 Hypertensive chronic kidney disease with stage 1 through stage 4 chronic kidney disease, or unspecified chronic kidney disease: Secondary | ICD-10-CM | POA: Diagnosis not present

## 2022-08-03 DIAGNOSIS — E6609 Other obesity due to excess calories: Secondary | ICD-10-CM | POA: Diagnosis not present

## 2022-08-03 DIAGNOSIS — R011 Cardiac murmur, unspecified: Secondary | ICD-10-CM

## 2022-08-03 DIAGNOSIS — N1832 Chronic kidney disease, stage 3b: Secondary | ICD-10-CM

## 2022-08-03 LAB — POCT URINALYSIS DIPSTICK
Bilirubin, UA: NEGATIVE
Glucose, UA: NEGATIVE
Ketones, UA: NEGATIVE
Leukocytes, UA: NEGATIVE
Nitrite, UA: NEGATIVE
Protein, UA: NEGATIVE
Spec Grav, UA: <=1.005 — AB (ref 1.010–1.025)
Urobilinogen, UA: 0.2 E.U./dL
pH, UA: 5.5 (ref 5.0–8.0)

## 2022-08-03 NOTE — Patient Instructions (Signed)

## 2022-08-03 NOTE — Progress Notes (Unsigned)
[name],acting as a Education administrator for [name], MD.,have documented all relevant documentation on the behalf of [name], MD,as directed by  [name], MD while in the presence of [name], MD.   Subjective:     Patient ID: Christina Terry , female    DOB: [birth year] , 88 y.o.   MRN: [id number]   Chief Complaint  Patient presents with   Annual Exam   Hypertension    HPI  Patient presents today for a physical exam. She reports compliance with meds. She denies headaches, chest pain and shortness of breath. Patient has no complaints at this time. She is in a rush today, she has 330 hair appt. She wants to get pretty for a birthday party that is later today.   Hypertension This is a chronic problem. The current episode started more than 1 year ago. The problem has been gradually improving since onset. The problem is controlled. Pertinent negatives include no blurred vision. Risk factors for coronary artery disease include obesity, post-menopausal state and sedentary lifestyle. Past treatments include angiotensin blockers. The current treatment provides moderate improvement. Compliance problems include exercise.      Past Medical History:  Diagnosis Date   Acute myocardial infarction, unspecified site, episode of care unspecified    Unspecified asthma(493.90)    Unspecified essential hypertension      Family History  Problem Relation Age of Onset   Emphysema Father    Alzheimer's disease Father    Heart disease Maternal Grandmother    Heart Problems Mother    Mental illness Mother    Alzheimer's disease Mother      Current Outpatient Medications:    albuterol (PROVENTIL HFA) 108 (90 Base) MCG/ACT inhaler, Inhale 2 puffs into the lungs every 6 (six) hours as needed for wheezing or shortness of breath ([facility]cue inahaler)., Disp: 1 each, Rfl: prn   atorvastatin (LIPITOR) 10 MG tablet, TAKE ONE TABLET BY MOUTH DAILY MONDAY THROUGH SATURDAY. SKIP SUNDAYS.,  Disp: 72 tablet, Rfl: 1   loratadine (CLARITIN) 10 MG tablet, TAKE 1 TABLET BY MOUTH EVERY DAY AS NEEDED FOR ALLERGY, Disp: 90 tablet, Rfl: 1   mometasone-formoterol (DULERA) 100-5 MCG/ACT AERO, TAKE 2 PUFFS BY MOUTH TWICE A DAY, Disp: 13 g, Rfl: 6   valsartan-hydrochlorothiazide (DIOVAN-HCT) 160-12.5 MG tablet, Take 1 tablet by mouth daily., Disp: 90 tablet, Rfl: 2   hydrocortisone (ANUSOL-HC) 25 MG suppository, Place 1 suppository (25 mg total) rectally 2 (two) times daily. (Patient not taking: Reported on 11/21/2021), Disp: 12 suppository, Rfl: 0   Allergies  Allergen Reactions   Aspirin     REACTION: wheeze   Sulfonamide Derivatives       The patient states she uses {contraceptive methods:5051} for birth control. Last LMP was No LMP recorded. Patient is postmenopausal.. {Dysmenorrhea-menorrhagia:21918}. Negative for: breast discharge, breast lump(s), breast pain and breast self exam. Associated symptoms include abnormal vaginal bleeding. Pertinent negatives include abnormal bleeding (hematology), anxiety, decreased libido, depression, difficulty falling sleep, dyspareunia, history of infertility, nocturia, sexual dysfunction, sleep disturbances, urinary incontinence, urinary urgency, vaginal discharge and vaginal itching. Diet regular.The patient states her exercise level is    . The patient's tobacco use is:  Social History   Tobacco Use  Smoking Status Never  Smokeless Tobacco Never  . She has been exposed to passive smoke. The patient's alcohol use is:  Social History   Substance and Sexual Activity  Alcohol Use No  . Additional information: Last pap ***, next one  scheduled for ***.    Review of Systems  Constitutional: Negative.   HENT: Negative.    Eyes: Negative.  Negative for blurred vision.  Respiratory: Negative.    Cardiovascular: Negative.   Gastrointestinal: Negative.   Endocrine: Negative.   Genitourinary: Negative.   Musculoskeletal: Negative.   Skin: Negative.    Allergic/Immunologic: Negative.   Neurological: Negative.   Hematological: Negative.   Psychiatric/Behavioral: Negative.       Today's Vitals   08/03/22 1403  BP: (!) 160/90  Pulse: 98  Temp: 97.7 F (36.5 C)  Weight: 172 lb 6.4 oz (78.2 kg)  Height: 5' 0.4" (1.534 m)  PainSc: 0-No pain   Body mass index is 33.23 kg/m.  Wt Readings from Last 3 Encounters:  08/03/22 172 lb 6.4 oz (78.2 kg)  05/03/22 176 lb 6.4 oz (80 kg)  05/03/22 180 lb (81.6 kg)     Objective:  Physical Exam Vitals and nursing note reviewed.  Constitutional:      Appearance: Normal appearance.  HENT:     Head: Normocephalic and atraumatic.     Right Ear: Tympanic membrane, ear canal and external ear normal.     Left Ear: Tympanic membrane, ear canal and external ear normal.     Nose:     Comments: Masked     Mouth/Throat:     Comments: Masked  Eyes:     Extraocular Movements: Extraocular movements intact.     Conjunctiva/sclera: Conjunctivae normal.     Pupils: Pupils are equal, round, and reactive to light.  Cardiovascular:     Rate and Rhythm: Normal rate and regular rhythm.     Pulses: Normal pulses.     Heart sounds: Murmur heard.  Pulmonary:     Effort: Pulmonary effort is normal.     Breath sounds: Normal breath sounds.  Chest:  Breasts:    Tanner Score is 5.     Right: Normal.     Left: Normal.     Comments: Pendulous Abdominal:     General: Bowel sounds are normal.     Palpations: Abdomen is soft.     Comments: Rounded, soft  Genitourinary:    Comments: deferred Musculoskeletal:        General: Normal range of motion.     Cervical back: Normal range of motion and neck supple.  Skin:    General: Skin is warm and dry.  Neurological:     General: No focal deficit present.     Mental Status: She is alert and oriented to person, place, and time.     Comments: Resting tremor   Psychiatric:        Mood and Affect: Mood normal.        Behavior: Behavior normal.       Assessment And Plan:     1. Encounter for general adult medical examination w/o abnormal findings  2. Hypertensive nephropathy - POCT Urinalysis Dipstick (81002) - Microalbumin / Creatinine Urine Ratio - EKG 12-Lead - CMP14+EGFR - Lipid panel - Insulin, random(561) - AMB Referral to [facility] ([facility] Patients)  3. Stage 3b chronic kidney disease ([place]) - CMP14+EGFR - AMB Referral to [facility] ([facility] Patients) - Phosphorus - Parathyroid Hormone, Intact w/Ca  4. Abnormal EKG Comments: EKG performed, NSR w/ PACs, nonspecific T wave abnormality  5. Class 1 obesity due to excess calories with serious comorbidity and body mass index (BMI) of 33.0 to 33.9 in adult - AMB Referral to [facility] ([facility]  Patients)   Patient was given opportunity to ask questions. Patient verbalized understanding of the plan and was able to repeat key elements of the plan. All questions were answered to their satisfaction.   I, [name], MD, have reviewed all documentation for this visit. The documentation on 08/03/22 for the exam, diagnosis, procedures, and orders are all accurate and complete.   THE PATIENT IS ENCOURAGED TO PRACTICE SOCIAL DISTANCING DUE TO THE COVID-19 PANDEMIC.

## 2022-08-04 ENCOUNTER — Telehealth: Payer: Self-pay | Admitting: *Deleted

## 2022-08-04 NOTE — Progress Notes (Signed)
  Care Coordination   Note   08/04/2022 Name: Candus Braud MRN: [id number] DOB: 1932/01/18  Anglia Blakley is a 88 y.o. year old female who sees Dorothyann Peng, MD for primary care. I reached out to Heath Lark by phone today to offer care coordination services.  Ms. Mecum was given information about Care Coordination services today including:   The Care Coordination services include support from the care team which includes your Nurse Coordinator, Clinical Social Worker, or Pharmacist.  The Care Coordination team is here to help remove barriers to the health concerns and goals most important to you. Care Coordination services are voluntary, and the patient may decline or stop services at any time by request to their care team member.   Care Coordination Consent Status: Patient agreed to services and verbal consent obtained.   Follow up plan:  Telephone appointment with care coordination team member scheduled for:  08/25/22  Encounter Outcome:  Pt. Scheduled  [facility] Coordination Care Guide  Direct Dial: [phone]

## 2022-08-08 ENCOUNTER — Ambulatory Visit: Payer: Medicare Other

## 2022-08-08 VITALS — BP 132/70 | HR 80 | Temp 98.3°F

## 2022-08-08 DIAGNOSIS — I129 Hypertensive chronic kidney disease with stage 1 through stage 4 chronic kidney disease, or unspecified chronic kidney disease: Secondary | ICD-10-CM | POA: Diagnosis not present

## 2022-08-08 DIAGNOSIS — N1832 Chronic kidney disease, stage 3b: Secondary | ICD-10-CM | POA: Diagnosis not present

## 2022-08-08 MED ORDER — AMLODIPINE BESYLATE 2.5 MG PO TABS: 2.5000 mg | ORAL_TABLET | Freq: Every evening | ORAL | 1 refills | Status: DC

## 2022-08-08 NOTE — Patient Instructions (Signed)
Hypertension, Adult High blood pressure (hypertension) is when the force of blood pumping through the arteries is too strong. The arteries are the blood vessels that carry blood from the heart throughout the body. Hypertension forces the heart to work harder to pump blood and may cause arteries to become narrow or stiff. Untreated or uncontrolled hypertension can lead to a heart attack, heart failure, a stroke, kidney disease, and other problems. A blood pressure reading consists of a higher number over a lower number. Ideally, your blood pressure should be below 120/80. The first ("top") number is called the systolic pressure. It is a measure of the pressure in your arteries as your heart beats. The second ("bottom") number is called the diastolic pressure. It is a measure of the pressure in your arteries as the heart relaxes. What are the causes? The exact cause of this condition is not known. There are some conditions that result in high blood pressure. What increases the risk? Certain factors may make you more likely to develop high blood pressure. Some of these risk factors are under your control, including: Smoking. Not getting enough exercise or physical activity. Being overweight. Having too much fat, sugar, calories, or salt (sodium) in your diet. Drinking too much alcohol. Other risk factors include: Having a personal history of heart disease, diabetes, high cholesterol, or kidney disease. Stress. Having a family history of high blood pressure and high cholesterol. Having obstructive sleep apnea. Age. The risk increases with age. What are the signs or symptoms? High blood pressure may not cause symptoms. Very high blood pressure (hypertensive crisis) may cause: Headache. Fast or irregular heartbeats (palpitations). Shortness of breath. Nosebleed. Nausea and vomiting. Vision changes. Severe chest pain, dizziness, and seizures. How is this diagnosed? This condition is diagnosed by  measuring your blood pressure while you are seated, with your arm resting on a flat surface, your legs uncrossed, and your feet flat on the floor. The cuff of the blood pressure monitor will be placed directly against the skin of your upper arm at the level of your heart. Blood pressure should be measured at least twice using the same arm. Certain conditions can cause a difference in blood pressure between your right and left arms. If you have a high blood pressure reading during one visit or you have normal blood pressure with other risk factors, you may be asked to: Return on a different day to have your blood pressure checked again. Monitor your blood pressure at home for 1 week or longer. If you are diagnosed with hypertension, you may have other blood or imaging tests to help your health care provider understand your overall risk for other conditions. How is this treated? This condition is treated by making healthy lifestyle changes, such as eating healthy foods, exercising more, and reducing your alcohol intake. You may be referred for counseling on a healthy diet and physical activity. Your health care provider may prescribe medicine if lifestyle changes are not enough to get your blood pressure under control and if: Your systolic blood pressure is above 130. Your diastolic blood pressure is above 80. Your personal target blood pressure may vary depending on your medical conditions, your age, and other factors. Follow these instructions at home: Eating and drinking  Eat a diet that is high in fiber and potassium, and low in sodium, added sugar, and fat. An example of this eating plan is called the DASH diet. DASH stands for Dietary Approaches to Stop Hypertension. To eat this way: Eat   plenty of fresh fruits and vegetables. Try to fill one half of your plate at each meal with fruits and vegetables. Eat whole grains, such as whole-wheat pasta, brown rice, or whole-grain bread. Fill about one  fourth of your plate with whole grains. Eat or drink low-fat dairy products, such as skim milk or low-fat yogurt. Avoid fatty cuts of meat, processed or cured meats, and poultry with skin. Fill about one fourth of your plate with lean proteins, such as fish, chicken without skin, beans, eggs, or tofu. Avoid pre-made and processed foods. These tend to be higher in sodium, added sugar, and fat. Reduce your daily sodium intake. Many people with hypertension should eat less than 1,500 mg of sodium a day. Do not drink alcohol if: Your health care provider tells you not to drink. You are pregnant, may be pregnant, or are planning to become pregnant. If you drink alcohol: Limit how much you have to: 0-1 drink a day for women. 0-2 drinks a day for men. Know how much alcohol is in your drink. In the [place] one drink equals one 12 oz bottle of beer (355 mL), one 5 oz glass of wine (148 mL), or one 1 oz glass of hard liquor (44 mL). Lifestyle  Work with your health care provider to maintain a healthy body weight or to lose weight. Ask what an ideal weight is for you. Get at least 30 minutes of exercise that causes your heart to beat faster (aerobic exercise) most days of the week. Activities may include walking, swimming, or biking. Include exercise to strengthen your muscles (resistance exercise), such as Pilates or lifting weights, as part of your weekly exercise routine. Try to do these types of exercises for 30 minutes at least 3 days a week. Do not use any products that contain nicotine or tobacco. These products include cigarettes, chewing tobacco, and vaping devices, such as e-cigarettes. If you need help quitting, ask your health care provider. Monitor your blood pressure at home as told by your health care provider. Keep all follow-up visits. This is important. Medicines Take over-the-counter and prescription medicines only as told by your health care provider. Follow directions carefully. Blood  pressure medicines must be taken as prescribed. Do not skip doses of blood pressure medicine. Doing this puts you at risk for problems and can make the medicine less effective. Ask your health care provider about side effects or reactions to medicines that you should watch for. Contact a health care provider if you: Think you are having a reaction to a medicine you are taking. Have headaches that keep coming back (recurring). Feel dizzy. Have swelling in your ankles. Have trouble with your vision. Get help right away if you: Develop a severe headache or confusion. Have unusual weakness or numbness. Feel faint. Have severe pain in your chest or abdomen. Vomit repeatedly. Have trouble breathing. These symptoms may be an emergency. Get help right away. Call 911. Do not wait to see if the symptoms will go away. Do not drive yourself to the hospital. Summary Hypertension is when the force of blood pumping through your arteries is too strong. If this condition is not controlled, it may put you at risk for serious complications. Your personal target blood pressure may vary depending on your medical conditions, your age, and other factors. For most people, a normal blood pressure is less than 120/80. Hypertension is treated with lifestyle changes, medicines, or a combination of both. Lifestyle changes include losing weight, eating a healthy,   low-sodium diet, exercising more, and limiting alcohol. This information is not intended to replace advice given to you by your health care provider. Make sure you discuss any questions you have with your health care provider. Document Revised: 06/14/2021 Document Reviewed: 06/14/2021 Elsevier Patient Education  2023 Elsevier Inc.  

## 2022-08-08 NOTE — Progress Notes (Signed)
Patient presents today for BPC. She is currently taking Valsartan-HCTZ 160-12.5mg    BP Readings from Last 3 Encounters:  08/08/22 138/82  08/03/22 (!) 160/90  05/03/22 132/80   Provider would like to start Amlodipine 2.5mg  nightly. She will follow up with provider in 4 weeks.

## 2022-08-09 LAB — CMP14+EGFR
ALT: 9 IU/L (ref 0–32)
AST: 20 IU/L (ref 0–40)
Albumin/Globulin Ratio: 1.1 — ABNORMAL LOW (ref 1.2–2.2)
Albumin: 3.8 g/dL (ref 3.7–4.7)
Alkaline Phosphatase: 52 IU/L (ref 44–121)
BUN/Creatinine Ratio: 18 (ref 12–28)
BUN: 23 mg/dL (ref 8–27)
Bilirubin Total: 0.4 mg/dL (ref 0.0–1.2)
CO2: 23 mmol/L (ref 20–29)
Calcium: 9.8 mg/dL (ref 8.7–10.3)
Chloride: 104 mmol/L (ref 96–106)
Creatinine, Ser: 1.29 mg/dL — ABNORMAL HIGH (ref 0.57–1.00)
Globulin, Total: 3.4 g/dL (ref 1.5–4.5)
Glucose: 91 mg/dL (ref 70–99)
Potassium: 4.7 mmol/L (ref 3.5–5.2)
Sodium: 141 mmol/L (ref 134–144)
Total Protein: 7.2 g/dL (ref 6.0–8.5)
eGFR: 40 mL/min/{1.73_m2} — ABNORMAL LOW (ref >59)

## 2022-08-09 LAB — LIPID PANEL
Chol/HDL Ratio: 2.2 ratio (ref 0.0–4.4)
Cholesterol, Total: 168 mg/dL (ref 100–199)
HDL: 78 mg/dL (ref >39)
LDL Chol Calc (NIH): 74 mg/dL (ref 0–99)
Triglycerides: 91 mg/dL (ref 0–149)
VLDL Cholesterol Cal: 16 mg/dL (ref 5–40)

## 2022-08-09 LAB — PTH, INTACT AND CALCIUM: PTH: 39 pg/mL (ref 15–65)

## 2022-08-09 LAB — MICROALBUMIN / CREATININE URINE RATIO
Creatinine, Urine: 35.8 mg/dL
Microalb/Creat Ratio: 27 mg/g creat (ref 0–29)
Microalbumin, Urine: 9.6 ug/mL

## 2022-08-09 LAB — INSULIN, RANDOM: INSULIN: 15.3 u[IU]/mL (ref 2.6–24.9)

## 2022-08-09 LAB — PHOSPHORUS: Phosphorus: 3.6 mg/dL (ref 3.0–4.3)

## 2022-08-25 ENCOUNTER — Ambulatory Visit: Payer: Self-pay

## 2022-08-25 NOTE — Patient Outreach (Signed)
  Care Coordination   08/25/2022 Name: Christina Terry MRN: [id number] DOB: Jun 21, 1932   Care Coordination Outreach Attempts:  An unsuccessful telephone outreach was attempted for a scheduled appointment today.  Follow Up Plan:  Additional outreach attempts will be made to offer the patient care coordination information and services.   Encounter Outcome:  No Answer   Care Coordination Interventions:  No, not indicated    Barb Merino, RN, BSN, CCM Care Management Coordinator [facility] Care Management Direct Phone: [phone]

## 2022-08-29 ENCOUNTER — Telehealth: Payer: Self-pay | Admitting: *Deleted

## 2022-08-29 NOTE — Progress Notes (Unsigned)
  Care Coordination Note  08/29/2022 Name: Christina Terry MRN: [id number] DOB: 08-17-1932  [name] is a 89 y.o. year old female who is a primary care patient of [name], MD and is actively engaged with the care management team. I reached out to [name] by phone today to assist with re-scheduling an initial visit with the RN Case Manager  Follow up plan: Unsuccessful telephone outreach attempt made.  [name]  Direct Dial: [phone]

## 2022-08-30 NOTE — Progress Notes (Signed)
  Care Coordination Note  08/30/2022 Name: Christina Terry MRN: [id number] DOB: November 23, 1931  Christina Terry is a 89 y.o. year old female who is a primary care patient of [name], MD and is actively engaged with the care management team. I reached out to [name] by phone today to assist with re-scheduling an initial visit with the RN Case Manager  Follow up plan: Telephone appointment with care management team member scheduled for:09/05/22  [place]: [phone]

## 2022-09-01 ENCOUNTER — Other Ambulatory Visit: Payer: Self-pay | Admitting: Internal Medicine

## 2022-09-05 ENCOUNTER — Ambulatory Visit: Payer: Self-pay

## 2022-09-05 NOTE — Patient Instructions (Signed)
Visit Information  Thank you for taking time to visit with me today. Please don't hesitate to contact me if I can be of assistance to you.   Following are the goals we discussed today:   Goals Addressed               This Visit's Progress     Patient Stated     To keep blood pressure better controlled (pt-stated)        Care Coordination Interventions: Evaluation of current treatment plan related to hypertension self management and patient's adherence to plan as established by provider Advised patient, providing education and rationale, to monitor blood pressure daily and record, calling PCP for findings outside established parameters Discussed complications of poorly controlled blood pressure such as kidney impairment Reviewed medications with patient and discussed importance of compliance    Determined patient will ask daughter in law to check BP at home and record readings over the next 2 weeks          To maintain kidney function (pt-stated)        Care Coordination Interventions: Assessed the Patient understanding of chronic kidney disease    Evaluation of current treatment plan related to chronic kidney disease self management and patient's adherence to plan as established by provider      Discussed complications of poorly controlled blood pressure such as kidney impairment    Provided education on kidney disease progression              Our next appointment is by telephone on 09/19/22 at 11:30 AM  Please call the care guide team at [phone] if you need to cancel or reschedule your appointment.   If you are experiencing a Mental Health or [place] or need someone to talk to, please go to [facility] [place] [phone])  The patient verbalized understanding of instructions, educational materials, and care plan provided today and agreed to receive a mailed copy of patient instructions,  educational materials, and care plan.   Barb Merino, RN, BSN, CCM Care Management Coordinator [facility] Care Management Direct Phone: [phone]

## 2022-09-05 NOTE — Patient Outreach (Signed)
  Care Coordination   Initial Visit Note   09/05/2022 Name: Christina Terry MRN: [id number] DOB: [birth year]  Christina Terry is a 89 y.o. year old female who sees Christina Chard, MD for primary care. I spoke with  Christina Terry by phone today.  What matters to the patients health and wellness today?  Patient would like to keep her blood pressure under better control. She would like to maintain her current kidney function.       Goals Addressed               This Visit's Progress     Patient Stated     To keep blood pressure better controlled (pt-stated)        Care Coordination Interventions: Evaluation of current treatment plan related to hypertension self management and patient's adherence to plan as established by provider Advised patient, providing education and rationale, to monitor blood pressure daily and record, calling PCP for findings outside established parameters Discussed complications of poorly controlled blood pressure such as kidney impairment Reviewed medications with patient and discussed importance of compliance    Determined patient will ask daughter in law to check BP at home and record readings over the next 2 weeks          To maintain kidney function (pt-stated)        Care Coordination Interventions: Assessed the Patient understanding of chronic kidney disease    Evaluation of current treatment plan related to chronic kidney disease self management and patient's adherence to plan as established by provider      Discussed complications of poorly controlled blood pressure such as kidney impairment    Provided education on kidney disease progression              SDOH assessments and interventions completed:  Yes  SDOH Interventions Today    Flowsheet Row Most Recent Value  SDOH Interventions   Transportation Interventions Intervention Not Indicated        Care Coordination Interventions:  Yes, provided   Follow up plan: Follow up call  scheduled for 09/19/22 @11 :30 AM    Encounter Outcome:  Pt. Visit Completed

## 2022-09-13 ENCOUNTER — Encounter: Payer: Self-pay | Admitting: Internal Medicine

## 2022-09-13 ENCOUNTER — Ambulatory Visit (INDEPENDENT_AMBULATORY_CARE_PROVIDER_SITE_OTHER): Payer: Medicare Other | Admitting: Internal Medicine

## 2022-09-13 VITALS — BP 122/70 | HR 80 | Temp 97.8°F | Ht 60.4 in | Wt 172.0 lb

## 2022-09-13 DIAGNOSIS — Z23 Encounter for immunization: Secondary | ICD-10-CM

## 2022-09-13 DIAGNOSIS — E6609 Other obesity due to excess calories: Secondary | ICD-10-CM | POA: Diagnosis not present

## 2022-09-13 DIAGNOSIS — I129 Hypertensive chronic kidney disease with stage 1 through stage 4 chronic kidney disease, or unspecified chronic kidney disease: Secondary | ICD-10-CM

## 2022-09-13 DIAGNOSIS — Z6833 Body mass index (BMI) 33.0-33.9, adult: Secondary | ICD-10-CM | POA: Diagnosis not present

## 2022-09-13 DIAGNOSIS — N1832 Chronic kidney disease, stage 3b: Secondary | ICD-10-CM | POA: Diagnosis not present

## 2022-09-13 NOTE — Progress Notes (Signed)
[name],acting as a Education administrator for [name], MD.,have documented all relevant documentation on the behalf of [name], MD,as directed by  [name], MD while in the presence of [name], MD.    Subjective:     Patient ID: Christina Terry , female    DOB: November 30, 1931 , 89 y.o.   MRN: [id number]   Chief Complaint  Patient presents with   Hypertension    HPI  Patient presents today for a bp check. Patient was started on amlodipine 2.5mg  during her nurse visit on 08/08/22. She has been taking this in the evenings. She has not had any issues with the medication.   Hypertension This is a chronic problem. The current episode started more than 1 year ago. The problem has been gradually improving since onset. The problem is controlled. Pertinent negatives include no blurred vision. Past treatments include angiotensin blockers and diuretics. The current treatment provides moderate improvement. Compliance problems include exercise.  Hypertensive end-organ damage includes kidney disease.     Past Medical History:  Diagnosis Date   Acute myocardial infarction, unspecified site, episode of care unspecified    Unspecified asthma(493.90)    Unspecified essential hypertension      Family History  Problem Relation Age of Onset   Emphysema Father    Alzheimer's disease Father    Heart disease Maternal Grandmother    Heart Problems Mother    Mental illness Mother    Alzheimer's disease Mother      Current Outpatient Medications:    albuterol (PROVENTIL HFA) 108 (90 Base) MCG/ACT inhaler, Inhale 2 puffs into the lungs every 6 (six) hours as needed for wheezing or shortness of breath (Rescue inahaler)., Disp: 1 each, Rfl: prn   amLODipine (NORVASC) 2.5 MG tablet, TAKE 1 TABLET BY MOUTH AT BEDTIME., Disp: 90 tablet, Rfl: 1   atorvastatin (LIPITOR) 10 MG tablet, TAKE ONE TABLET BY MOUTH DAILY MONDAY THROUGH SATURDAY. SKIP SUNDAYS., Disp: 72 tablet, Rfl: 1    hydrocortisone (ANUSOL-HC) 25 MG suppository, Place 1 suppository (25 mg total) rectally 2 (two) times daily., Disp: 12 suppository, Rfl: 0   loratadine (CLARITIN) 10 MG tablet, TAKE 1 TABLET BY MOUTH EVERY DAY AS NEEDED FOR ALLERGY, Disp: 90 tablet, Rfl: 1   mometasone-formoterol (DULERA) 100-5 MCG/ACT AERO, TAKE 2 PUFFS BY MOUTH TWICE A DAY, Disp: 13 g, Rfl: 6   valsartan-hydrochlorothiazide (DIOVAN-HCT) 160-12.5 MG tablet, Take 1 tablet by mouth daily., Disp: 90 tablet, Rfl: 2   Allergies  Allergen Reactions   Aspirin     REACTION: wheeze   Sulfonamide Derivatives      Review of Systems  Constitutional: Negative.   Eyes: Negative.  Negative for blurred vision.  Respiratory: Negative.    Cardiovascular: Negative.   Musculoskeletal: Negative.   Skin: Negative.   Neurological: Negative.   Psychiatric/Behavioral: Negative.       Today's Vitals   09/13/22 0923 09/13/22 1033  BP: (!) 140/72 122/70  Pulse: 80   Temp: 97.8 F (36.6 C)   Weight: 172 lb (78 kg)   Height: 5' 0.4" (1.534 m)   PainSc: 0-No pain    Body mass index is 33.15 kg/m.  Wt Readings from Last 3 Encounters:  09/13/22 172 lb (78 kg)  08/03/22 172 lb 6.4 oz (78.2 kg)  05/03/22 176 lb 6.4 oz (80 kg)    BP Readings from Last 3 Encounters:  09/13/22 122/70  08/08/22 132/70  08/03/22 (!) 160/90    Objective:  Physical Exam Vitals and nursing note reviewed.  Constitutional:      Appearance: Normal appearance. She is obese.  HENT:     Head: Normocephalic and atraumatic.     Nose:     Comments: Masked     Mouth/Throat:     Comments: Masked  Eyes:     Extraocular Movements: Extraocular movements intact.  Cardiovascular:     Rate and Rhythm: Normal rate and regular rhythm.     Heart sounds: Normal heart sounds.  Pulmonary:     Effort: Pulmonary effort is normal.     Breath sounds: Normal breath sounds.  Musculoskeletal:     Cervical back: Normal range of motion.  Skin:    General: Skin is warm.   Neurological:     General: No focal deficit present.     Mental Status: She is alert.  Psychiatric:        Mood and Affect: Mood normal.        Behavior: Behavior normal.         Assessment And Plan:     1. Hypertensive nephropathy Comments: Chronic, improved control. She will c/w valsartan/hct and amlodipine. Importance of medication/dietary compliance was d/w patient.  2. Stage 3b chronic kidney disease ([place]) Comments: Chronic, reminded to stay well hydrated and avoid NSAIDs. Kidney risk in 5 years is 2%. Pt wants to consult with Nephrology, she has upcoming appt.  3. Class 1 obesity due to excess calories with serious comorbidity and body mass index (BMI) of 33.0 to 33.9 in adult Comments: She is encouraged to incorporate more exercise into her daily routine. She is advised to strive to lose ten pounds to decrease cardiac risk.  4. Immunization due Comments: She was given Tdap, this will be billed via [employer]. - Tdap vaccine greater than or equal to 7yo IM   Patient was given opportunity to ask questions. Patient verbalized understanding of the plan and was able to repeat key elements of the plan. All questions were answered to their satisfaction.   I, [name], MD, have reviewed all documentation for this visit. The documentation on 09/13/22 for the exam, diagnosis, procedures, and orders are all accurate and complete.   IF YOU HAVE BEEN REFERRED TO A SPECIALIST, IT MAY TAKE 1-2 WEEKS TO SCHEDULE/PROCESS THE REFERRAL. IF YOU HAVE NOT HEARD FROM US/SPECIALIST IN TWO WEEKS, PLEASE GIVE [place] A CALL AT [phone] X [phone].   THE PATIENT IS ENCOURAGED TO PRACTICE SOCIAL DISTANCING DUE TO THE COVID-19 PANDEMIC.

## 2022-09-13 NOTE — Patient Instructions (Signed)
Hypertension, Adult Hypertension is another name for high blood pressure. High blood pressure forces your heart to work harder to pump blood. This can cause problems over time. There are two numbers in a blood pressure reading. There is a top number (systolic) over a bottom number (diastolic). It is best to have a blood pressure that is below 120/80. What are the causes? The cause of this condition is not known. Some other conditions can lead to high blood pressure. What increases the risk? Some lifestyle factors can make you more likely to develop high blood pressure: Smoking. Not getting enough exercise or physical activity. Being overweight. Having too much fat, sugar, calories, or salt (sodium) in your diet. Drinking too much alcohol. Other risk factors include: Having any of these conditions: Heart disease. Diabetes. High cholesterol. Kidney disease. Obstructive sleep apnea. Having a family history of high blood pressure and high cholesterol. Age. The risk increases with age. Stress. What are the signs or symptoms? High blood pressure may not cause symptoms. Very high blood pressure (hypertensive crisis) may cause: Headache. Fast or uneven heartbeats (palpitations). Shortness of breath. Nosebleed. Vomiting or feeling like you may vomit (nauseous). Changes in how you see. Very bad chest pain. Feeling dizzy. Seizures. How is this treated? This condition is treated by making healthy lifestyle changes, such as: Eating healthy foods. Exercising more. Drinking less alcohol. Your doctor may prescribe medicine if lifestyle changes do not help enough and if: Your top number is above 130. Your bottom number is above 80. Your personal target blood pressure may vary. Follow these instructions at home: Eating and drinking  If told, follow the DASH eating plan. To follow this plan: Fill one half of your plate at each meal with fruits and vegetables. Fill one fourth of your plate  at each meal with whole grains. Whole grains include whole-wheat pasta, brown rice, and whole-grain bread. Eat or drink low-fat dairy products, such as skim milk or low-fat yogurt. Fill one fourth of your plate at each meal with low-fat (lean) proteins. Low-fat proteins include fish, chicken without skin, eggs, beans, and tofu. Avoid fatty meat, cured and processed meat, or chicken with skin. Avoid pre-made or processed food. Limit the amount of salt in your diet to less than 1,500 mg each day. Do not drink alcohol if: Your doctor tells you not to drink. You are pregnant, may be pregnant, or are planning to become pregnant. If you drink alcohol: Limit how much you have to: 0-1 drink a day for women. 0-2 drinks a day for men. Know how much alcohol is in your drink. In the [place] one drink equals one 12 oz bottle of beer (355 mL), one 5 oz glass of wine (148 mL), or one 1 oz glass of hard liquor (44 mL). Lifestyle  Work with your doctor to stay at a healthy weight or to lose weight. Ask your doctor what the best weight is for you. Get at least 30 minutes of exercise that causes your heart to beat faster (aerobic exercise) most days of the week. This may include walking, swimming, or biking. Get at least 30 minutes of exercise that strengthens your muscles (resistance exercise) at least 3 days a week. This may include lifting weights or doing Pilates. Do not smoke or use any products that contain nicotine or tobacco. If you need help quitting, ask your doctor. Check your blood pressure at home as told by your doctor. Keep all follow-up visits. Medicines Take over-the-counter and prescription medicines  only as told by your doctor. Follow directions carefully. ?Do not skip doses of blood pressure medicine. The medicine does not work as well if you skip doses. Skipping doses also puts you at risk for problems. ?Ask your doctor about side effects or reactions to medicines that you should watch  for. ?Contact a doctor if: ?You think you are having a reaction to the medicine you are taking. ?You have headaches that keep coming back. ?You feel dizzy. ?You have swelling in your ankles. ?You have trouble with your vision. ?Get help right away if: ?You get a very bad headache. ?You start to feel mixed up (confused). ?You feel weak or numb. ?You feel faint. ?You have very bad pain in your: ?Chest. ?Belly (abdomen). ?You vomit more than once. ?You have trouble breathing. ?These symptoms may be an emergency. Get help right away. Call 911. ?Do not wait to see if the symptoms will go away. ?Do not drive yourself to the hospital. ?Summary ?Hypertension is another name for high blood pressure. ?High blood pressure forces your heart to work harder to pump blood. ?For most people, a normal blood pressure is less than 120/80. ?Making healthy choices can help lower blood pressure. If your blood pressure does not get lower with healthy choices, you may need to take medicine. ?This information is not intended to replace advice given to you by your health care provider. Make sure you discuss any questions you have with your health care provider. ?Document Revised: 05/26/2021 Document Reviewed: 05/26/2021 ?Elsevier Patient Education ? 2023 Elsevier Inc. ? ?

## 2022-09-19 ENCOUNTER — Ambulatory Visit: Payer: Self-pay

## 2022-09-19 NOTE — Patient Outreach (Signed)
  Care Coordination   Follow Up Visit Note   09/19/2022 Name: Teylor Wolven MRN: [id number] DOB: 03/09/1932  Koi Yarbro is a 89 y.o. year old female who sees [name], MD for primary care. I spoke with  [name] by phone today.  What matters to the patients health and wellness today?  Patient wishes to stay physically active and living independently for as long as possible.     Goals Addressed               This Visit's Progress     Patient Stated     To keep blood pressure better controlled (pt-stated)        Care Coordination Interventions: Evaluation of current treatment plan related to hypertension self management and patient's adherence to plan as established by provider Reviewed medications with patient and discussed importance of compliance Provided education on prescribed diet low Sodium Determined patient's daughter in law is a nurse and monitor patient's BP at home, patient reports average BP at home has been running in 120's/60's Discussed patient feels her BP is elevated only when she is worried or anxious Counseled patient on the benefits of using deep breathing, meditation and prayer to help calm her nerves if worried about something Determined patient sometimes worries about her elderly sisters who are both placed in nursing homes, she does speak with them over the phone on a regular basis but wishes they were still in their homes like she is Determined patient also works in her yard when able to puts together jigsaw puzzles to help ease her mind and keep her busy           SDOH assessments and interventions completed:  No     Care Coordination Interventions:  Yes, provided   Follow up plan: Follow up call scheduled for 11/17/22 @11 :30 AM    Encounter Outcome:  Pt. Visit Completed

## 2022-09-19 NOTE — Patient Instructions (Signed)
Visit Information  Thank you for taking time to visit with me today. Please don't hesitate to contact me if I can be of assistance to you.   Following are the goals we discussed today:   Goals Addressed               This Visit's Progress     Patient Stated     To keep blood pressure better controlled (pt-stated)        Care Coordination Interventions: Evaluation of current treatment plan related to hypertension self management and patient's adherence to plan as established by provider Reviewed medications with patient and discussed importance of compliance Provided education on prescribed diet low Sodium Determined patient's daughter in law is a nurse and monitor patient's BP at home, patient reports average BP at home has been running in 120's/60's Discussed patient feels her BP is elevated only when she is worried or anxious Counseled patient on the benefits of using deep breathing, meditation and prayer to help calm her nerves if worried about something Determined patient sometimes worries about her elderly sisters who are both placed in nursing homes, she does speak with them over the phone on a regular basis but wishes they were still in their homes like she is Determined patient also works in her yard when able to puts together jigsaw puzzles to help ease her mind and keep her busy           Our next appointment is by telephone on 11/17/22 at 11:30 AM  Please call the care guide team at [phone] if you need to cancel or reschedule your appointment.   If you are experiencing a Mental Health or [place] or need someone to talk to, please go to [facility] [place] [phone])  The patient verbalized understanding of instructions, educational materials, and care plan provided today and agreed to receive a mailed copy of patient instructions, educational materials, and care plan.   Barb Merino,  RN, BSN, CCM Care Management Coordinator [facility] Care Management Direct Phone: [phone]

## 2022-10-02 ENCOUNTER — Ambulatory Visit: Payer: Medicare Other | Admitting: Internal Medicine

## 2022-10-02 DIAGNOSIS — E559 Vitamin D deficiency, unspecified: Secondary | ICD-10-CM | POA: Diagnosis not present

## 2022-10-02 DIAGNOSIS — N1832 Chronic kidney disease, stage 3b: Secondary | ICD-10-CM | POA: Diagnosis not present

## 2022-10-02 DIAGNOSIS — I129 Hypertensive chronic kidney disease with stage 1 through stage 4 chronic kidney disease, or unspecified chronic kidney disease: Secondary | ICD-10-CM | POA: Diagnosis not present

## 2022-10-02 DIAGNOSIS — N281 Cyst of kidney, acquired: Secondary | ICD-10-CM | POA: Diagnosis not present

## 2022-10-30 ENCOUNTER — Other Ambulatory Visit: Payer: Self-pay | Admitting: Internal Medicine

## 2022-11-17 ENCOUNTER — Ambulatory Visit: Payer: Self-pay

## 2022-11-17 NOTE — Patient Outreach (Signed)
  Care Coordination   11/17/2022 Name: Tosheba Keylon MRN: [id number] DOB: 1931/10/07   Care Coordination Outreach Attempts:  An unsuccessful telephone outreach was attempted for a scheduled appointment today.  Follow Up Plan:  Additional outreach attempts will be made to offer the patient care coordination information and services.   Encounter Outcome:  No Answer   Care Coordination Interventions:  No, not indicated    Barb Merino, RN, BSN, CCM Care Management Coordinator [facility] Care Management Direct Phone: [phone]

## 2022-12-04 ENCOUNTER — Ambulatory Visit: Payer: Self-pay

## 2022-12-04 NOTE — Patient Instructions (Signed)
Visit Information  Thank you for taking time to visit with me today. Please don't hesitate to contact me if I can be of assistance to you.   Following are the goals we discussed today:   Goals Addressed               This Visit's Progress     Patient Stated     To keep blood pressure better controlled (pt-stated)        Care Coordination Interventions: Evaluation of current treatment plan related to hypertension self management and patient's adherence to plan as established by provider Advised patient, providing education and rationale, to monitor blood pressure daily and record, calling PCP for findings outside established parameters Provided written education on prescribed diet low Sodium Determined patient's daughter in law intermittently checks patient's BP, she also uses [facility]'s automated BP cuff  Instructed patient to notify her PCP of new symptoms or concerns  Reviewed next scheduled upcoming PCP follow up with Dr. Allyne Gee for BP check scheduled on 01/22/23 @09 :40 AM Mailed printed educational materials related to limiting Sodium and BP categories  Last practice BP readings:  BP Readings from Last 3 Encounters:  09/13/22 122/70  08/08/22 132/70  08/03/22 (!) 160/90   Most recent eGFR/CrCl:  Lab Results  Component Value Date   EGFR 40 (L) 08/08/2022    No components found for: "CRCL"        Our next appointment is by telephone on 01/24/23 @09 :30 AM   Please call the care guide team at [phone] if you need to cancel or reschedule your appointment.   If you are experiencing a Mental Health or Behavioral Health Crisis or need someone to talk to, please call [phone]-TALK (toll free, 24 hour hotline) go to [facility] [address] [place] [phone])  The patient verbalized understanding of instructions, educational materials, and care plan provided today and DECLINED offer to receive copy of patient instructions,  educational materials, and care plan.   Delsa Sale, RN, BSN, CCM Care Management Coordinator [facility] Care Management Direct Phone: [phone]

## 2022-12-04 NOTE — Patient Outreach (Signed)
  Care Coordination   Follow Up Visit Note   12/04/2022 Name: Christina Terry MRN: [id number] DOB: [birth year]  Christina Terry is a 89 y.o. year old female who sees Dorothyann Peng, MD for primary care. I spoke with  Heath Lark by phone today.  What matters to the patients health and wellness today?  Patient will continue to monitor her BP at home and notify her PCP of abnormal readings or concerns.     Goals Addressed               This Visit's Progress     Patient Stated     To keep blood pressure better controlled (pt-stated)        Care Coordination Interventions: Evaluation of current treatment plan related to hypertension self management and patient's adherence to plan as established by provider Advised patient, providing education and rationale, to monitor blood pressure daily and record, calling PCP for findings outside established parameters Provided written education on prescribed diet low Sodium Determined patient's daughter in law intermittently checks patient's BP, she also uses [facility] automated BP cuff  Instructed patient to notify her PCP of new symptoms or concerns  Reviewed next scheduled upcoming PCP follow up with Dr. Allyne Gee for BP check scheduled on 01/22/23 @09 :40 AM Mailed printed educational materials related to limiting Sodium and BP categories  Last practice recorded BP readings:  BP Readings from Last 3 Encounters:  09/13/22 122/70  08/08/22 132/70  08/03/22 (!) 160/90  Most recent eGFR/CrCl:  Lab Results  Component Value Date   EGFR 40 (L) 08/08/2022    No components found for: "CRCL"     Interventions Today    Flowsheet Row Most Recent Value  Chronic Disease   Chronic disease during today's visit Hypertension (HTN)  General Interventions   General Interventions Discussed/Reviewed General Interventions Discussed, General Interventions Reviewed, Doctor Visits  Doctor Visits Discussed/Reviewed PCP  Education Interventions   Education  Provided Provided Education, Provided Printed Education  Nutrition Interventions   Nutrition Discussed/Reviewed Nutrition Discussed          SDOH assessments and interventions completed:  No     Care Coordination Interventions:  Yes, provided   Follow up plan: Follow up call scheduled for 01/24/23 @09 :30 AM    Encounter Outcome:  Pt. Visit Completed

## 2023-01-22 ENCOUNTER — Ambulatory Visit: Payer: Medicare Other | Admitting: Internal Medicine

## 2023-01-22 NOTE — Progress Notes (Deleted)
Subjective:     Patient ID: Christina Terry , female    DOB: 07-29-1932 , 89 y.o.   MRN: [id number]   No chief complaint on file.   HPI  Patient presents today for a bp check. Patient was started on amlodipine 2.5mg  during her nurse visit on 08/08/22. She has been taking this in the evenings. She has not had any issues with the medication.   Hypertension This is a chronic problem. The current episode started more than 1 year ago. The problem has been gradually improving since onset. The problem is controlled. Pertinent negatives include no blurred vision. Past treatments include angiotensin blockers and diuretics. The current treatment provides moderate improvement. Compliance problems include exercise.  Hypertensive end-organ damage includes kidney disease.     Past Medical History:  Diagnosis Date   Acute myocardial infarction, unspecified site, episode of care unspecified    Unspecified asthma(493.90)    Unspecified essential hypertension      Family History  Problem Relation Age of Onset   Emphysema Father    Alzheimer's disease Father    Heart disease Maternal Grandmother    Heart Problems Mother    Mental illness Mother    Alzheimer's disease Mother      Current Outpatient Medications:    loratadine (CLARITIN) 10 MG tablet, TAKE 1 TABLET BY MOUTH EVERY DAY AS NEEDED FOR ALLERGY, Disp: 90 tablet, Rfl: 1   albuterol (PROVENTIL HFA) 108 (90 Base) MCG/ACT inhaler, Inhale 2 puffs into the lungs every 6 (six) hours as needed for wheezing or shortness of breath (Rescue inahaler)., Disp: 1 each, Rfl: prn   amLODipine (NORVASC) 2.5 MG tablet, TAKE 1 TABLET BY MOUTH AT BEDTIME., Disp: 90 tablet, Rfl: 1   atorvastatin (LIPITOR) 10 MG tablet, TAKE ONE TABLET BY MOUTH DAILY MONDAY THROUGH SATURDAY. SKIP SUNDAYS., Disp: 72 tablet, Rfl: 1   hydrocortisone (ANUSOL-HC) 25 MG suppository, Place 1 suppository (25 mg total) rectally 2 (two) times daily., Disp: 12 suppository, Rfl: 0    mometasone-formoterol (DULERA) 100-5 MCG/ACT AERO, TAKE 2 PUFFS BY MOUTH TWICE A DAY, Disp: 13 g, Rfl: 6   valsartan-hydrochlorothiazide (DIOVAN-HCT) 160-12.5 MG tablet, Take 1 tablet by mouth daily., Disp: 90 tablet, Rfl: 2   Allergies  Allergen Reactions   Aspirin     REACTION: wheeze   Sulfonamide Derivatives      Review of Systems  Constitutional: Negative.   Eyes:  Negative for blurred vision.  Respiratory: Negative.    Cardiovascular: Negative.   Neurological: Negative.   Psychiatric/Behavioral: Negative.       There were no vitals filed for this visit. There is no height or weight on file to calculate BMI.  The ASCVD Risk score (Arnett DK, et al., 2019) failed to calculate for the following reasons:   The 2019 ASCVD risk score is only valid for ages 10 to 33   The patient has a prior MI or stroke diagnosis ++ Objective:  Physical Exam      Assessment And Plan:     1. Hypertensive nephropathy  2. Stage 3b chronic kidney disease (HCC)    No follow-ups on file.  Patient was given opportunity to ask questions. Patient verbalized understanding of the plan and was able to repeat key elements of the plan. All questions were answered to their satisfaction.  Coolidge Breeze, CMA   I, Coolidge Breeze, CMA, have reviewed all documentation for this visit. The documentation on 01/22/23 for the exam, diagnosis, procedures, and orders  are all accurate and complete.   IF YOU HAVE BEEN REFERRED TO A SPECIALIST, IT MAY TAKE 1-2 WEEKS TO SCHEDULE/PROCESS THE REFERRAL. IF YOU HAVE NOT HEARD FROM US/SPECIALIST IN TWO WEEKS, PLEASE GIVE [place] A CALL AT [phone].   THE PATIENT IS ENCOURAGED TO PRACTICE SOCIAL DISTANCING DUE TO THE COVID-19 PANDEMIC.

## 2023-01-24 ENCOUNTER — Ambulatory Visit: Payer: Self-pay

## 2023-01-24 NOTE — Patient Outreach (Signed)
  Care Coordination   Follow Up Visit Note   01/24/2023 Name: Ticia Steptoe MRN: [id number] DOB: October 16, 1931  Samuella Bello is a 89 y.o. year old female who sees Dorothyann Peng, MD for primary care. I spoke with  Heath Lark by phone today.  What matters to the patients health and wellness today?  Patient will reschedule her missed PCP follow up. She will ask her daughter in law to check her BP and record her readings.     Goals Addressed               This Visit's Progress     Patient Stated     To keep blood pressure better controlled (pt-stated)        Care Coordination Interventions: Evaluation of current treatment plan related to hypertension self management and patient's adherence to plan as established by provider Advised patient, providing education and rationale, to monitor blood pressure daily and record, calling PCP for findings outside established parameters Determined patient's daughter in law continues to intermittently check her BP, she also uses [facility] automated BP cuff  Instructed patient to record her readings in order to share with her PCP during next scheduled visit  Determined patient missed her PCP follow up with Dr. Allyne Gee for a BP check scheduled on 01/22/23 @09 :40 AM, she states she did not get a reminder call Sent message to Dr. Allyne Gee requesting the office call to reschedule the missed appointment   Last practice recorded BP readings:  BP Readings from Last 3 Encounters:  09/13/22 122/70  08/08/22 132/70  08/03/22 (!) 160/90  Most recent eGFR/CrCl:  Lab Results  Component Value Date   EGFR 40 (L) 08/08/2022    No components found for: "CRCL"      To maintain kidney function (pt-stated)        Care Coordination Interventions: Assessed the Patient understanding of chronic kidney disease    Evaluation of current treatment plan related to chronic kidney disease self management and patient's adherence to plan as established by provider     Discussed with patient she is adhering to consuming 64 oz of water daily Positive reinforcement given to patient for making efforts to improve her kidney function and overall health  Advised patient, providing education and rationale, to monitor blood pressure daily and record, calling PCP for findings outside established parameters Last practice recorded BP readings:  BP Readings from Last 3 Encounters:  09/13/22 122/70  08/08/22 132/70  08/03/22 (!) 160/90  Most recent eGFR/CrCl:  Lab Results  Component Value Date   EGFR 40 (L) 08/08/2022    No components found for: "CRCL"    Interventions Today    Flowsheet Row Most Recent Value  Chronic Disease   Chronic disease during today's visit Chronic Kidney Disease/End Stage Renal Disease (ESRD), Hypertension (HTN)  General Interventions   General Interventions Discussed/Reviewed General Interventions Discussed, General Interventions Reviewed, Labs, Doctor Visits, Durable Medical Equipment (DME)  Doctor Visits Discussed/Reviewed Doctor Visits Discussed, Doctor Visits Reviewed, PCP  Durable Medical Equipment (DME) BP Cuff  Education Interventions   Education Provided Provided Education  Provided Verbal Education On Nutrition  Nutrition Interventions   Nutrition Discussed/Reviewed Nutrition Discussed, Nutrition Reviewed, Fluid intake          SDOH assessments and interventions completed:  No     Care Coordination Interventions:  Yes, provided   Follow up plan: Follow up call scheduled for 03/26/23 @11 :00 AM     Encounter Outcome:  Pt. Visit Completed

## 2023-01-24 NOTE — Patient Instructions (Signed)
Visit Information  Thank you for taking time to visit with me today. Please don't hesitate to contact me if I can be of assistance to you.   Following are the goals we discussed today:   Goals Addressed               This Visit's Progress     Patient Stated     To keep blood pressure better controlled (pt-stated)        Care Coordination Interventions: Evaluation of current treatment plan related to hypertension self management and patient's adherence to plan as established by provider Advised patient, providing education and rationale, to monitor blood pressure daily and record, calling PCP for findings outside established parameters Determined patient's daughter in law continues to intermittently check her BP, she also uses [facility] automated BP cuff  Instructed patient to record her readings in order to share with her PCP during next scheduled visit  Determined patient missed her PCP follow up with Dr. Allyne Gee for a BP check scheduled on 01/22/23 @09 :40 AM, she states she did not get a reminder call Sent message to Dr. Allyne Gee requesting the office call to reschedule the missed appointment   Last practice recorded BP readings:  BP Readings from Last 3 Encounters:  09/13/22 122/70  08/08/22 132/70  08/03/22 (!) 160/90  Most recent eGFR/CrCl:  Lab Results  Component Value Date   EGFR 40 (L) 08/08/2022    No components found for: "CRCL"       To maintain kidney function (pt-stated)        Care Coordination Interventions: Assessed the Patient understanding of chronic kidney disease    Evaluation of current treatment plan related to chronic kidney disease self management and patient's adherence to plan as established by provider    Discussed with patient she is adhering to consuming 64 oz of water daily Positive reinforcement given to patient for making efforts to improve her kidney function and overall health  Advised patient, providing education and rationale, to monitor  blood pressure daily and record, calling PCP for findings outside established parameters Last practice recorded BP readings:  BP Readings from Last 3 Encounters:  09/13/22 122/70  08/08/22 132/70  08/03/22 (!) 160/90  Most recent eGFR/CrCl:  Lab Results  Component Value Date   EGFR 40 (L) 08/08/2022    No components found for: "CRCL"        Our next appointment is by telephone on 03/26/23 at 11:00 AM  Please call the care guide team at [phone] if you need to cancel or reschedule your appointment.   If you are experiencing a Mental Health or Behavioral Health Crisis or need someone to talk to, please call 1-800-273-TALK (toll free, 24 hour hotline)  The patient verbalized understanding of instructions, educational materials, and care plan provided today and DECLINED offer to receive copy of patient instructions, educational materials, and care plan.   Delsa Sale, RN, BSN, CCM Care Management Coordinator [facility] Care Management  Direct Phone: [phone]

## 2023-01-29 ENCOUNTER — Encounter: Payer: Self-pay | Admitting: Internal Medicine

## 2023-01-29 ENCOUNTER — Ambulatory Visit (INDEPENDENT_AMBULATORY_CARE_PROVIDER_SITE_OTHER): Payer: Medicare Other | Admitting: Internal Medicine

## 2023-01-29 VITALS — BP 132/74 | HR 78 | Temp 97.9°F | Ht 60.0 in | Wt 170.6 lb

## 2023-01-29 DIAGNOSIS — L659 Nonscarring hair loss, unspecified: Secondary | ICD-10-CM

## 2023-01-29 DIAGNOSIS — N1832 Chronic kidney disease, stage 3b: Secondary | ICD-10-CM | POA: Diagnosis not present

## 2023-01-29 DIAGNOSIS — I129 Hypertensive chronic kidney disease with stage 1 through stage 4 chronic kidney disease, or unspecified chronic kidney disease: Secondary | ICD-10-CM

## 2023-01-29 DIAGNOSIS — R251 Tremor, unspecified: Secondary | ICD-10-CM

## 2023-01-29 DIAGNOSIS — Z6833 Body mass index (BMI) 33.0-33.9, adult: Secondary | ICD-10-CM

## 2023-01-29 DIAGNOSIS — E6609 Other obesity due to excess calories: Secondary | ICD-10-CM

## 2023-01-29 NOTE — Progress Notes (Signed)
Subjective:  Patient ID: Christina Terry , female    DOB: Oct 15, 1931 , 89 y.o.   MRN: [id number]  Chief Complaint  Patient presents with   Hypertension    HPI  Patient presents today for a bp check. She reports compliance with medications. Denies headache, chest pain, and SOB.  She adds that her beautician has noticed hair loss. Patient feels this is due to the new blood pressure medication. She is not sure what else could have contributed to her symptoms.   She would like to come another day to receive her shingles vaccine.   Hypertension This is a chronic problem. The current episode started more than 1 year ago. The problem has been gradually improving since onset. The problem is controlled. Pertinent negatives include no blurred vision. Risk factors for coronary artery disease include sedentary lifestyle. Past treatments include angiotensin blockers and diuretics. The current treatment provides moderate improvement. Compliance problems include exercise.  Hypertensive end-organ damage includes kidney disease.     Past Medical History:  Diagnosis Date   Acute myocardial infarction, unspecified site, episode of care unspecified    Unspecified asthma(493.90)    Unspecified essential hypertension      Family History  Problem Relation Age of Onset   Emphysema Father    Alzheimer's disease Father    Heart disease Maternal Grandmother    Heart Problems Mother    Mental illness Mother    Alzheimer's disease Mother      Current Outpatient Medications:    albuterol (PROVENTIL HFA) 108 (90 Base) MCG/ACT inhaler, Inhale 2 puffs into the lungs every 6 (six) hours as needed for wheezing or shortness of breath (Rescue inahaler)., Disp: 1 each, Rfl: prn   amLODipine (NORVASC) 2.5 MG tablet, TAKE 1 TABLET BY MOUTH AT BEDTIME., Disp: 90 tablet, Rfl: 1   atorvastatin (LIPITOR) 10 MG tablet, TAKE ONE TABLET BY MOUTH DAILY MONDAY THROUGH SATURDAY. SKIP SUNDAYS., Disp: 72 tablet, Rfl: 1    hydrocortisone (ANUSOL-HC) 25 MG suppository, Place 1 suppository (25 mg total) rectally 2 (two) times daily., Disp: 12 suppository, Rfl: 0   loratadine (CLARITIN) 10 MG tablet, TAKE 1 TABLET BY MOUTH EVERY DAY AS NEEDED FOR ALLERGY, Disp: 90 tablet, Rfl: 1   mometasone-formoterol (DULERA) 100-5 MCG/ACT AERO, TAKE 2 PUFFS BY MOUTH TWICE A DAY, Disp: 13 g, Rfl: 6   valsartan-hydrochlorothiazide (DIOVAN-HCT) 160-12.5 MG tablet, Take 1 tablet by mouth daily., Disp: 90 tablet, Rfl: 2   Allergies  Allergen Reactions   Aspirin     REACTION: wheeze   Sulfonamide Derivatives      Review of Systems  Constitutional: Negative.   Eyes:  Negative for blurred vision.  Respiratory: Negative.    Cardiovascular: Negative.   Gastrointestinal: Negative.   Musculoskeletal: Negative.   Neurological: Negative.   Psychiatric/Behavioral: Negative.       Today's Vitals   01/29/23 0910  BP: 132/74  Pulse: 78  Temp: 97.9 F (36.6 C)  Weight: 170 lb 9.6 oz (77.4 kg)  Height: 5' (1.524 m)   Body mass index is 33.32 kg/m.  Wt Readings from Last 3 Encounters:  01/29/23 170 lb 9.6 oz (77.4 kg)  09/13/22 172 lb (78 kg)  08/03/22 172 lb 6.4 oz (78.2 kg)    The ASCVD Risk score (Arnett DK, et al., 2019) failed to calculate for the following reasons:   The 2019 ASCVD risk score is only valid for ages 35 to 75   The patient has a prior MI or stroke diagnosis  Objective:  Physical Exam Vitals and nursing note reviewed.  Constitutional:      Appearance: Normal appearance. She is obese.  HENT:     Head: Normocephalic and atraumatic.  Eyes:     Extraocular Movements: Extraocular movements intact.  Cardiovascular:     Rate and Rhythm: Normal rate and regular rhythm.     Heart sounds: Normal heart sounds.  Pulmonary:     Effort: Pulmonary effort is normal.     Breath sounds: Normal breath sounds.  Musculoskeletal:     Cervical back: Normal range of motion.  Skin:    General: Skin is warm.      Comments: No areas of hair loss noted.  Neurological:     General: No focal deficit present.     Mental Status: She is alert.  Psychiatric:        Mood and Affect: Mood normal.        Behavior: Behavior normal.      Assessment And Plan:  1. Hypertensive nephropathy Comments: Chronic, fair control. Goal BP<120/80.  Advised to follow low sodium diet. She will c/2 valsartan/hct 160/12.5mg  and amlodipine 2.5mg  daily. - CMP14+EGFR  2. Stage 3b chronic kidney disease (HCC) Comments: Chronic, reminded to avoid NSAIDs, stay well hydrated and keep BP well controlled to decrease risk of CKD progression.  3. Hair loss Comments: Sx noted by her hairstylist. I will check labs as below. - CBC - TSH - Iron, TIBC and Ferritin Panel - ANA, IFA (with reflex)  4. Tremor Comments: Chronic, she declines Neurology evaluation. I will check TSH today.  5. Class 1 obesity due to excess calories with serious comorbidity and body mass index (BMI) of 33.0 to 33.9 in adult Comments: She is encouraged to aim for at least 150 minutes of exercise per week.   Return for Fri [place] Shingles, Keep Sept appts.  Patient was given opportunity to ask questions. Patient verbalized understanding of the plan and was able to repeat key elements of the plan. All questions were answered to their satisfaction.   I, [name], MD, have reviewed all documentation for this visit. The documentation on 01/29/23 for the exam, diagnosis, procedures, and orders are all accurate and complete.   IF YOU HAVE BEEN REFERRED TO A SPECIALIST, IT MAY TAKE 1-2 WEEKS TO SCHEDULE/PROCESS THE REFERRAL. IF YOU HAVE NOT HEARD FROM US/SPECIALIST IN TWO WEEKS, PLEASE GIVE [place] A CALL AT [phone].

## 2023-01-29 NOTE — Patient Instructions (Signed)
Hypertension, Adult Hypertension is another name for high blood pressure. High blood pressure forces your heart to work harder to pump blood. This can cause problems over time. There are two numbers in a blood pressure reading. There is a top number (systolic) over a bottom number (diastolic). It is best to have a blood pressure that is below 120/80. What are the causes? The cause of this condition is not known. Some other conditions can lead to high blood pressure. What increases the risk? Some lifestyle factors can make you more likely to develop high blood pressure: Smoking. Not getting enough exercise or physical activity. Being overweight. Having too much fat, sugar, calories, or salt (sodium) in your diet. Drinking too much alcohol. Other risk factors include: Having any of these conditions: Heart disease. Diabetes. High cholesterol. Kidney disease. Obstructive sleep apnea. Having a family history of high blood pressure and high cholesterol. Age. The risk increases with age. Stress. What are the signs or symptoms? High blood pressure may not cause symptoms. Very high blood pressure (hypertensive crisis) may cause: Headache. Fast or uneven heartbeats (palpitations). Shortness of breath. Nosebleed. Vomiting or feeling like you may vomit (nauseous). Changes in how you see. Very bad chest pain. Feeling dizzy. Seizures. How is this treated? This condition is treated by making healthy lifestyle changes, such as: Eating healthy foods. Exercising more. Drinking less alcohol. Your doctor may prescribe medicine if lifestyle changes do not help enough and if: Your top number is above 130. Your bottom number is above 80. Your personal target blood pressure may vary. Follow these instructions at home: Eating and drinking  If told, follow the DASH eating plan. To follow this plan: Fill one half of your plate at each meal with fruits and vegetables. Fill one fourth of your plate  at each meal with whole grains. Whole grains include whole-wheat pasta, brown rice, and whole-grain bread. Eat or drink low-fat dairy products, such as skim milk or low-fat yogurt. Fill one fourth of your plate at each meal with low-fat (lean) proteins. Low-fat proteins include fish, chicken without skin, eggs, beans, and tofu. Avoid fatty meat, cured and processed meat, or chicken with skin. Avoid pre-made or processed food. Limit the amount of salt in your diet to less than 1,500 mg each day. Do not drink alcohol if: Your doctor tells you not to drink. You are pregnant, may be pregnant, or are planning to become pregnant. If you drink alcohol: Limit how much you have to: 0-1 drink a day for women. 0-2 drinks a day for men. Know how much alcohol is in your drink. In the [place] one drink equals one 12 oz bottle of beer (355 mL), one 5 oz glass of wine (148 mL), or one 1 oz glass of hard liquor (44 mL). Lifestyle  Work with your doctor to stay at a healthy weight or to lose weight. Ask your doctor what the best weight is for you. Get at least 30 minutes of exercise that causes your heart to beat faster (aerobic exercise) most days of the week. This may include walking, swimming, or biking. Get at least 30 minutes of exercise that strengthens your muscles (resistance exercise) at least 3 days a week. This may include lifting weights or doing Pilates. Do not smoke or use any products that contain nicotine or tobacco. If you need help quitting, ask your doctor. Check your blood pressure at home as told by your doctor. Keep all follow-up visits. Medicines Take over-the-counter and prescription medicines   only as told by your doctor. Follow directions carefully. Do not skip doses of blood pressure medicine. The medicine does not work as well if you skip doses. Skipping doses also puts you at risk for problems. Ask your doctor about side effects or reactions to medicines that you should watch  for. Contact a doctor if: You think you are having a reaction to the medicine you are taking. You have headaches that keep coming back. You feel dizzy. You have swelling in your ankles. You have trouble with your vision. Get help right away if: You get a very bad headache. You start to feel mixed up (confused). You feel weak or numb. You feel faint. You have very bad pain in your: Chest. Belly (abdomen). You vomit more than once. You have trouble breathing. These symptoms may be an emergency. Get help right away. Call 911. Do not wait to see if the symptoms will go away. Do not drive yourself to the hospital. Summary Hypertension is another name for high blood pressure. High blood pressure forces your heart to work harder to pump blood. For most people, a normal blood pressure is less than 120/80. Making healthy choices can help lower blood pressure. If your blood pressure does not get lower with healthy choices, you may need to take medicine. This information is not intended to replace advice given to you by your health care provider. Make sure you discuss any questions you have with your health care provider. Document Revised: 05/26/2021 Document Reviewed: 05/26/2021 Elsevier Patient Education  2024 Elsevier Inc.  

## 2023-02-01 LAB — CMP14+EGFR
ALT: 8 IU/L (ref 0–32)
AST: 20 IU/L (ref 0–40)
Albumin/Globulin Ratio: 1.2
Albumin: 4.1 g/dL (ref 3.7–4.7)
Alkaline Phosphatase: 57 IU/L (ref 44–121)
BUN/Creatinine Ratio: 23 (ref 12–28)
BUN: 29 mg/dL — ABNORMAL HIGH (ref 8–27)
Bilirubin Total: 0.5 mg/dL (ref 0.0–1.2)
CO2: 24 mmol/L (ref 20–29)
Calcium: 9.7 mg/dL (ref 8.7–10.3)
Chloride: 105 mmol/L (ref 96–106)
Creatinine, Ser: 1.26 mg/dL — ABNORMAL HIGH (ref 0.57–1.00)
Globulin, Total: 3.5 g/dL (ref 1.5–4.5)
Glucose: 91 mg/dL (ref 70–99)
Potassium: 4.5 mmol/L (ref 3.5–5.2)
Sodium: 140 mmol/L (ref 134–144)
Total Protein: 7.6 g/dL (ref 6.0–8.5)
eGFR: 41 mL/min/{1.73_m2} — ABNORMAL LOW (ref >59)

## 2023-02-01 LAB — IRON,TIBC AND FERRITIN PANEL
Ferritin: 675 ng/mL — ABNORMAL HIGH (ref 15–150)
Iron Saturation: 42 % (ref 15–55)
Iron: 90 ug/dL (ref 27–139)
Total Iron Binding Capacity: 214 ug/dL — ABNORMAL LOW (ref 250–450)
UIBC: 124 ug/dL (ref 118–369)

## 2023-02-01 LAB — ANTINUCLEAR ANTIBODIES, IFA: ANA Titer 1: POSITIVE — AB

## 2023-02-01 LAB — CBC
Hematocrit: 34.9 % (ref 34.0–46.6)
Hemoglobin: 11.2 g/dL (ref 11.1–15.9)
MCH: 31.6 pg (ref 26.6–33.0)
MCHC: 32.1 g/dL (ref 31.5–35.7)
MCV: 99 fL — ABNORMAL HIGH (ref 79–97)
Platelets: 221 10*3/uL (ref 150–450)
RBC: 3.54 x10E6/uL — ABNORMAL LOW (ref 3.77–5.28)
RDW: 11.6 % — ABNORMAL LOW (ref 11.7–15.4)
WBC: 5 10*3/uL (ref 3.4–10.8)

## 2023-02-01 LAB — FANA STAINING PATTERNS: Homogeneous Pattern: 1:160 {titer} — ABNORMAL HIGH

## 2023-02-01 LAB — TSH: TSH: 0.554 u[IU]/mL (ref 0.450–4.500)

## 2023-02-02 ENCOUNTER — Ambulatory Visit: Payer: Medicare Other

## 2023-02-24 ENCOUNTER — Other Ambulatory Visit: Payer: Self-pay | Admitting: Internal Medicine

## 2023-02-28 ENCOUNTER — Other Ambulatory Visit: Payer: Self-pay | Admitting: Internal Medicine

## 2023-03-26 ENCOUNTER — Ambulatory Visit: Payer: Self-pay

## 2023-03-26 NOTE — Patient Outreach (Signed)
  Care Coordination   03/26/2023 Name: Christina Terry MRN: [id number] DOB: May 16, 1932   Care Coordination Outreach Attempts:  An unsuccessful telephone outreach was attempted for a scheduled appointment today.  Follow Up Plan:  Additional outreach attempts will be made to offer the patient care coordination information and services.   Encounter Outcome:  No Answer   Care Coordination Interventions:  No, not indicated    Delsa Sale, RN, BSN, CCM Care Management Coordinator [facility] Care Management  Direct Phone: [phone]

## 2023-04-11 ENCOUNTER — Telehealth: Payer: Self-pay | Admitting: *Deleted

## 2023-04-11 NOTE — Progress Notes (Signed)
  Care Coordination Note  04/11/2023 Name: Christina Terry MRN: [id number] DOB: 12-09-1931  Christina Terry is a 89 y.o. year old female who is a primary care patient of Dorothyann Peng, MD and is actively engaged with the care management team. I reached out to Heath Lark by phone today to assist with re-scheduling a follow up visit with the RN Case Manager  Follow up plan: Telephone appointment with care management team member scheduled for:04/26/23  [facility] Coordination Care Guide  Direct Dial: [phone]

## 2023-04-11 NOTE — Progress Notes (Signed)
  Care Coordination Note  04/11/2023 Name: [name] MRN: [id number] DOB: [birth year]  Christina Terry is a 89 y.o. year old female who is a primary care patient of Dorothyann Peng, MD and is actively engaged with the care management team. I reached out to Heath Lark by phone today to assist with re-scheduling a follow up visit with the RN Case Manager  Follow up plan: Unsuccessful telephone outreach attempt made. A HIPAA compliant phone message was left for the patient providing contact information and requesting a return call.   [facility]  Care Coordination Care Guide  Direct Dial: [phone]

## 2023-04-26 ENCOUNTER — Ambulatory Visit: Payer: Self-pay

## 2023-04-26 NOTE — Patient Outreach (Signed)
  Care Coordination   04/26/2023 Name: Christina Terry MRN: [id number] DOB: 02-21-1932   Care Coordination Outreach Attempts:  An unsuccessful telephone outreach was attempted for a scheduled appointment today.  Follow Up Plan:  Additional outreach attempts will be made to offer the patient care coordination information and services.   Encounter Outcome:  No Answer   Care Coordination Interventions:  No, not indicated    Delsa Sale, RN, BSN, CCM Care Management Coordinator [facility] Care Management  Direct Phone: [phone]

## 2023-04-29 ENCOUNTER — Other Ambulatory Visit: Payer: Self-pay | Admitting: Internal Medicine

## 2023-05-15 ENCOUNTER — Encounter: Payer: Self-pay | Admitting: Internal Medicine

## 2023-05-15 ENCOUNTER — Ambulatory Visit (INDEPENDENT_AMBULATORY_CARE_PROVIDER_SITE_OTHER): Payer: Medicare Other | Admitting: Internal Medicine

## 2023-05-15 VITALS — BP 140/80 | HR 61 | Temp 98.1°F | Ht 60.0 in | Wt 172.0 lb

## 2023-05-15 DIAGNOSIS — N1832 Chronic kidney disease, stage 3b: Secondary | ICD-10-CM

## 2023-05-15 DIAGNOSIS — I129 Hypertensive chronic kidney disease with stage 1 through stage 4 chronic kidney disease, or unspecified chronic kidney disease: Secondary | ICD-10-CM | POA: Diagnosis not present

## 2023-05-15 DIAGNOSIS — E6609 Other obesity due to excess calories: Secondary | ICD-10-CM

## 2023-05-15 DIAGNOSIS — R7989 Other specified abnormal findings of blood chemistry: Secondary | ICD-10-CM

## 2023-05-15 DIAGNOSIS — R011 Cardiac murmur, unspecified: Secondary | ICD-10-CM | POA: Diagnosis not present

## 2023-05-15 DIAGNOSIS — Z23 Encounter for immunization: Secondary | ICD-10-CM

## 2023-05-15 DIAGNOSIS — Z6833 Body mass index (BMI) 33.0-33.9, adult: Secondary | ICD-10-CM

## 2023-05-15 NOTE — Progress Notes (Signed)
I,Jameka J Llittleton, CMA,acting as a Neurosurgeon for [name], MD.,have documented all relevant documentation on the behalf of [name], MD,as directed by  [name], MD while in the presence of [name], MD.  Subjective:  Patient ID: Christina Terry , female    DOB: Jan 01, 1932 , 89 y.o.   MRN: [id number]  Chief Complaint  Patient presents with   Hypertension    HPI  Patient presents today for a bp check. She reports compliance with medications. Denies headache, chest pain, and SOB. Patient reports her kidney doctor wants her to have blood work done today.   Hypertension This is a chronic problem. The current episode started more than 1 year ago. The problem has been gradually improving since onset. The problem is controlled. Pertinent negatives include no blurred vision. Risk factors for coronary artery disease include sedentary lifestyle. Past treatments include angiotensin blockers and diuretics. The current treatment provides moderate improvement. Compliance problems include exercise.  Hypertensive end-organ damage includes kidney disease.     Past Medical History:  Diagnosis Date   Acute myocardial infarction, unspecified site, episode of care unspecified    Unspecified asthma(493.90)    Unspecified essential hypertension      Family History  Problem Relation Age of Onset   Emphysema Father    Alzheimer's disease Father    Heart disease Maternal Grandmother    Heart Problems Mother    Mental illness Mother    Alzheimer's disease Mother      Current Outpatient Medications:    albuterol (PROVENTIL HFA) 108 (90 Base) MCG/ACT inhaler, Inhale 2 puffs into the lungs every 6 (six) hours as needed for wheezing or shortness of breath (Rescue inahaler). (Patient not taking: Reported on 05/17/2023), Disp: 1 each, Rfl: prn   amLODipine (NORVASC) 2.5 MG tablet, TAKE 1 TABLET BY MOUTH EVERYDAY AT BEDTIME, Disp: 90 tablet, Rfl: 1   atorvastatin (LIPITOR) 10 MG tablet,  TAKE ONE TABLET BY MOUTH DAILY MONDAY THROUGH SATURDAY. SKIP SUNDAYS., Disp: 72 tablet, Rfl: 1   hydrocortisone (ANUSOL-HC) 25 MG suppository, Place 1 suppository (25 mg total) rectally 2 (two) times daily., Disp: 12 suppository, Rfl: 0   loratadine (CLARITIN) 10 MG tablet, TAKE 1 TABLET BY MOUTH EVERY DAY AS NEEDED FOR ALLERGY, Disp: 90 tablet, Rfl: 1   mometasone-formoterol (DULERA) 100-5 MCG/ACT AERO, TAKE 2 PUFFS BY MOUTH TWICE A DAY, Disp: 13 g, Rfl: 6   valsartan-hydrochlorothiazide (DIOVAN-HCT) 160-12.5 MG tablet, TAKE 1 TABLET BY MOUTH EVERY DAY, Disp: 90 tablet, Rfl: 2   Allergies  Allergen Reactions   Aspirin     REACTION: wheeze   Sulfonamide Derivatives      Review of Systems  Constitutional: Negative.   Eyes:  Negative for blurred vision.  Respiratory: Negative.    Cardiovascular: Negative.   Gastrointestinal: Negative.   Neurological: Negative.   Psychiatric/Behavioral: Negative.       Today's Vitals   05/15/23 1543 05/15/23 1553  BP: (!) 150/90 (!) 140/80  Pulse: 61   Temp: 98.1 F (36.7 C)   Weight: 172 lb (78 kg)   Height: 5' (1.524 m)   PainSc: 0-No pain    Body mass index is 33.59 kg/m.  Wt Readings from Last 3 Encounters:  05/15/23 172 lb (78 kg)  01/29/23 170 lb 9.6 oz (77.4 kg)  09/13/22 172 lb (78 kg)    The ASCVD Risk score (Arnett DK, et al., 2019) failed to calculate for the following reasons:   The 2019 ASCVD  risk score is only valid for ages 5 to 45   The patient has a prior MI or stroke diagnosis  Objective:  Physical Exam Vitals and nursing note reviewed.  Constitutional:      Appearance: Normal appearance. She is obese.  HENT:     Head: Normocephalic and atraumatic.  Eyes:     Extraocular Movements: Extraocular movements intact.  Cardiovascular:     Rate and Rhythm: Normal rate and regular rhythm.     Heart sounds: Murmur heard.  Pulmonary:     Effort: Pulmonary effort is normal.     Breath sounds: Normal breath sounds.   Musculoskeletal:     Cervical back: Normal range of motion.  Skin:    General: Skin is warm.  Neurological:     General: No focal deficit present.     Mental Status: She is alert.  Psychiatric:        Mood and Affect: Mood normal.        Behavior: Behavior normal.         Assessment And Plan:  Hypertensive nephropathy Assessment & Plan: Chronic, uncontrolled. She states her blood pressure is better controlled at home. For now, she will continue with amlodipine 2.5mg  daily and valsartan/hct 160/12.5mg  daily. Encouraged to follow low sodium diet. She will f/u in 3-4 months. If elevated at that time, will increase amlodipine 5mg  daily.   Orders: -     CBC -     CMP14+EGFR -     Renal function panel -     Lipid panel  Stage 3b chronic kidney disease (HCC) Assessment & Plan: Chronic, now followed by Nephrology. I will check labs as below and forward to her nephrologist. Chronic, she is encouraged to stay well hydrated, avoid NSAIDs and keep BP controlled to prevent progression of CKD.    Orders: -     Renal function panel -     PTH, intact and calcium -     Protein electrophoresis, serum  Low TSH level Assessment & Plan: I will check thyroid panel today. I will make further recommendations after reviewing the results.   Orders: -     TSH + free T4  Heart murmur -     ECHOCARDIOGRAM COMPLETE; Future  Class 1 obesity due to excess calories with serious comorbidity and body mass index (BMI) of 33.0 to 33.9 in adult Assessment & Plan: She is encouraged to strive for BMI less than 30 to decrease cardiac risk. Advised to aim for at least 150 minutes of exercise per week.    Immunization due -     Flu Vaccine Trivalent High Dose (Fluad)    Return if symptoms worsen or fail to improve.  Patient was given opportunity to ask questions. Patient verbalized understanding of the plan and was able to repeat key elements of the plan. All questions were answered to their  satisfaction.    I, [name], MD, have reviewed all documentation for this visit. The documentation on 05/15/23 for the exam, diagnosis, procedures, and orders are all accurate and complete.   IF YOU HAVE BEEN REFERRED TO A SPECIALIST, IT MAY TAKE 1-2 WEEKS TO SCHEDULE/PROCESS THE REFERRAL. IF YOU HAVE NOT HEARD FROM US/SPECIALIST IN TWO WEEKS, PLEASE GIVE [place] A CALL AT [phone].

## 2023-05-17 ENCOUNTER — Ambulatory Visit: Payer: Self-pay | Admitting: Internal Medicine

## 2023-05-17 ENCOUNTER — Ambulatory Visit: Payer: Medicare Other

## 2023-05-17 DIAGNOSIS — Z0000 Encounter for general adult medical examination without abnormal findings: Secondary | ICD-10-CM

## 2023-05-17 NOTE — Patient Instructions (Signed)
Christina Terry , Thank you for taking time to come for your Medicare Wellness Visit. I appreciate your ongoing commitment to your health goals. Please review the following plan we discussed and let me know if I can assist you in the future.   Referrals/Orders/Follow-Ups/Clinician Recommendations: none  This is a list of the screening recommended for you and due dates:  Health Maintenance  Topic Date Due   Zoster (Shingles) Vaccine (2 of 2) 11/21/2021   COVID-19 Vaccine (5 - 2023-24 season) 04/22/2023   Medicare Annual Wellness Visit  05/16/2024   DTaP/Tdap/Td vaccine (3 - Td or Tdap) 09/13/2032   Pneumonia Vaccine  Completed   Flu Shot  Completed   DEXA scan (bone density measurement)  Completed   HPV Vaccine  Aged Out    Advanced directives: (Copy Requested) Please bring a copy of your health care power of attorney and living will to the office to be added to your chart at your convenience.  Next Medicare Annual Wellness Visit scheduled for next year: No, office will schedule appointment  Insert Preventive Care attachment Insert FALL PREVENTION attachment if needed

## 2023-05-17 NOTE — Progress Notes (Signed)
Subjective:   Christina Terry is a 89 y.o. female who presents for Medicare Annual (Subsequent) preventive examination.  Visit Complete: Virtual  I connected with  Heath Lark on 05/17/23 by a audio enabled telemedicine application and verified that I am speaking with the correct person using two identifiers.  Patient Location: Home  Provider Location: Office/Clinic  I discussed the limitations of evaluation and management by telemedicine. The patient expressed understanding and agreed to proceed.  Because this visit was a virtual/telehealth visit, some criteria may be missing or patient reported. Any vitals not documented were not able to be obtained and vitals that have been documented are patient reported.   Cardiac Risk Factors include: advanced age (>68men, >36 women);hypertension     Objective:    Today's Vitals   There is no height or weight on file to calculate BMI.     05/17/2023    3:03 PM 05/03/2022   11:59 AM 04/20/2021   10:24 AM 03/04/2020    2:27 PM 02/20/2019    9:39 AM  Advanced Directives  Does Patient Have a Medical Advance Directive? Yes No Yes No Yes  Type of Estate agent of [place];Living will  Healthcare Power of [place];Living will  Healthcare Power of [place];Living will  Copy of Healthcare Power of Attorney in Chart? No - copy requested  No - copy requested  No - copy requested  Would patient like information on creating a medical advance directive?    No - Patient declined     Current Medications (verified) Outpatient Encounter Medications as of 05/17/2023  Medication Sig   amLODipine (NORVASC) 2.5 MG tablet TAKE 1 TABLET BY MOUTH EVERYDAY AT BEDTIME   atorvastatin (LIPITOR) 10 MG tablet TAKE ONE TABLET BY MOUTH DAILY MONDAY THROUGH SATURDAY. SKIP SUNDAYS.   hydrocortisone (ANUSOL-HC) 25 MG suppository Place 1 suppository (25 mg total) rectally 2 (two) times daily.   loratadine (CLARITIN) 10 MG tablet TAKE 1 TABLET BY MOUTH  EVERY DAY AS NEEDED FOR ALLERGY   mometasone-formoterol (DULERA) 100-5 MCG/ACT AERO TAKE 2 PUFFS BY MOUTH TWICE A DAY   valsartan-hydrochlorothiazide (DIOVAN-HCT) 160-12.5 MG tablet TAKE 1 TABLET BY MOUTH EVERY DAY   albuterol (PROVENTIL HFA) 108 (90 Base) MCG/ACT inhaler Inhale 2 puffs into the lungs every 6 (six) hours as needed for wheezing or shortness of breath ([facility] in[facility]aler). (Patient not taking: Reported on 05/17/2023)   No facility-administered encounter medications on file as of 05/17/2023.    Allergies (verified) Aspirin and Sulfonamide derivatives   History: Past Medical History:  Diagnosis Date   Acute myocardial infarction, unspecified site, episode of care unspecified    Unspecified asthma(493.90)    Unspecified essential hypertension    Past Surgical History:  Procedure Laterality Date   sinus surgey     TOTAL ABDOMINAL HYSTERECTOMY W/ BILATERAL SALPINGOOPHORECTOMY     Family History  Problem Relation Age of Onset   Emphysema Father    Alzheimer's disease Father    Heart disease Maternal Grandmother    Heart Problems Mother    Mental illness Mother    Alzheimer's disease Mother    Social History   Socioeconomic History   Marital status: Widowed    Spouse name: Not on file   Number of children: Not on file   Years of education: Not on file   Highest education level: Not on file  Occupational History   Occupation: Cook  Tobacco Use   Smoking status: Never   Smokeless tobacco: Never  Vaping Use  Vaping status: Never Used  Substance and Sexual Activity   Alcohol use: No   Drug use: No   Sexual activity: Not Currently  Other Topics Concern   Not on file  Social History Narrative   Not on file   Social Determinants of Health   Financial Resource Strain: Low Risk  (05/17/2023)   Overall Financial Resource Strain (CARDIA)    Difficulty of Paying Living Expenses: Not hard at all  Food Insecurity: No Food Insecurity (05/17/2023)   Hunger Vital Sign     Worried About Running Out of Food in the Last Year: Never true    Ran Out of Food in the Last Year: Never true  Transportation Needs: No Transportation Needs (05/17/2023)   PRAPARE - Administrator, Civil Service (Medical): No    Lack of Transportation (Non-Medical): No  Physical Activity: Insufficiently Active (05/17/2023)   Exercise Vital Sign    Days of Exercise per Week: 7 days    Minutes of Exercise per Session: 20 min  Stress: No Stress Concern Present (05/17/2023)   [employer] of Occupational Health - Occupational Stress Questionnaire    Feeling of Stress : Not at all  Social Connections: Moderately Integrated (05/17/2023)   Social Connection and Isolation Panel [NHANES]    Frequency of Communication with Friends and Family: More than three times a week    Frequency of Social Gatherings with Friends and Family: Never    Attends Religious Services: More than 4 times per year    Active Member of [employer] or Organizations: Yes    Attends Banker Meetings: More than 4 times per year    Marital Status: Widowed    Tobacco Counseling Counseling given: Not Answered   Clinical Intake:  Pre-visit preparation completed: Yes  Pain : No/denies pain     Nutritional Risks: None Diabetes: No  How often do you need to have someone help you when you read instructions, pamphlets, or other written materials from your doctor or pharmacy?: 1 - Never  Interpreter Needed?: No  Information entered by :: NAllen LPN   Activities of Daily Living    05/17/2023    2:57 PM  In your present state of health, do you have any difficulty performing the following activities:  Hearing? 1  Comment wears hearing aids  Vision? 0  Difficulty concentrating or making decisions? 0  Walking or climbing stairs? 0  Dressing or bathing? 0  Doing errands, shopping? 0  Preparing Food and eating ? N  Using the Toilet? N  In the past six months, have you accidently leaked  urine? N  Do you have problems with loss of bowel control? N  Managing your Medications? N  Managing your Finances? N  Housekeeping or managing your Housekeeping? N    Patient Care Team: Dorothyann Peng, MD as PCP - General (Internal Medicine) Clarene Duke, Karma Lew, RN as [facility] Care Management  Indicate any recent Medical Services you may have received from other than [facility] providers in the past year (date may be approximate).     Assessment:   This is a routine wellness examination for [place].  Hearing/Vision screen Hearing Screening - Comments:: Has hearing aids that are maintained Vision Screening - Comments:: No regular eye exams   Goals Addressed             This Visit's Progress    Patient Stated       05/17/2023, stay healthy and independent  Depression Screen    05/17/2023    3:05 PM 01/29/2023    9:21 AM 05/03/2022   12:00 PM 04/20/2021   10:27 AM 03/04/2020    2:29 PM 05/28/2019   11:37 AM 02/20/2019    9:39 AM  PHQ 2/9 Scores  PHQ - 2 Score 0 0 0 1 0 0 0  PHQ- 9 Score 0 0   1      Fall Risk    05/17/2023    3:04 PM 01/29/2023    9:20 AM 09/13/2022    9:27 AM 05/03/2022   11:59 AM 04/20/2021   10:26 AM  Fall Risk   Falls in the past year? 0 0 0 0 0  Number falls in past yr: 0 0 0 0   Injury with Fall? 0 0 0 0   Risk for fall due to : Medication side effect No Fall Risks No Fall Risks Medication side effect Medication side effect  Follow up Falls prevention discussed;Falls evaluation completed Falls evaluation completed Falls evaluation completed Falls prevention discussed;Education provided;Falls evaluation completed Falls evaluation completed;Education provided;Falls prevention discussed    MEDICARE RISK AT HOME: Medicare Risk at Home Any stairs in or around the home?: Yes If so, are there any without handrails?: No Home free of loose throw rugs in walkways, pet beds, electrical cords, etc?: Yes Adequate lighting in your home to reduce  risk of falls?: Yes Life alert?: No Use of a cane, walker or w/c?: No Grab bars in the bathroom?: Yes Shower chair or bench in shower?: No Elevated toilet seat or a handicapped toilet?: Yes  TIMED UP AND GO:  Was the test performed?  No    Cognitive Function:        05/17/2023    3:06 PM 05/03/2022   12:01 PM 04/20/2021   10:29 AM 03/04/2020    2:32 PM 02/20/2019    9:42 AM  6CIT Screen  What Year? 0 points 0 points 0 points 0 points 0 points  What month? 0 points 0 points 0 points 0 points 0 points  What time? 0 points 0 points 0 points 0 points 3 points  Count back from 20 0 points 0 points 0 points 0 points 0 points  Months in reverse 2 points 2 points 2 points 2 points 2 points  Repeat phrase 0 points 6 points 4 points 4 points 0 points  Total Score 2 points 8 points 6 points 6 points 5 points    Immunizations Immunization History  Administered Date(s) Administered   Fluad Quad(high Dose 65+) 05/06/2019, 05/03/2022   Fluad Trivalent(High Dose 65+) 05/15/2023   Influenza Split 08/02/2011   Influenza Whole 05/19/2009, 05/21/2010   Influenza, High Dose Seasonal PF 06/04/2018, 05/30/2021   Influenza-Unspecified 05/06/2019   Moderna Sars-Covid-2 Vaccination 09/11/2019, 10/17/2019, 08/17/2020, 01/25/2021   Pneumococcal Conjugate-13 06/26/2018   Pneumococcal Polysaccharide-23 05/19/2009, 04/29/2020   Tdap 09/29/2011, 09/13/2022   Zoster Recombinant(Shingrix) 09/26/2021    TDAP status: Up to date  Flu Vaccine status: Up to date  Pneumococcal vaccine status: Up to date  Covid-19 vaccine status: Information provided on how to obtain vaccines.   Qualifies for Shingles Vaccine? Yes   Zostavax completed No   Shingrix Completed?: needs second dose  Screening Tests Health Maintenance  Topic Date Due   Zoster Vaccines- Shingrix (2 of 2) 11/21/2021   COVID-19 Vaccine (5 - 2023-24 season) 04/22/2023   Medicare Annual Wellness (AWV)  05/16/2024   DTaP/Tdap/Td (3 - Td or  Tdap) 09/13/2032  Pneumonia Vaccine 85+ Years old  Completed   INFLUENZA VACCINE  Completed   DEXA SCAN  Completed   HPV VACCINES  Aged Out    Health Maintenance  Health Maintenance Due  Topic Date Due   Zoster Vaccines- Shingrix (2 of 2) 11/21/2021   COVID-19 Vaccine (5 - 2023-24 season) 04/22/2023    Colorectal cancer screening: No longer required.   Mammogram status: No longer required due to age.  Bone Density status: Completed 12/08/2011.   Lung Cancer Screening: (Low Dose CT Chest recommended if Age 16-80 years, 20 pack-year currently smoking OR have quit w/in 15years.) does not qualify.   Lung Cancer Screening Referral: no  Additional Screening:  Hepatitis C Screening: does not qualify;   Vision Screening: Recommended annual ophthalmology exams for early detection of glaucoma and other disorders of the eye. Is the patient up to date with their annual eye exam?  Yes  Who is the provider or what is the name of the office in which the patient attends annual eye exams? Can't remember name If pt is not established with a provider, would they like to be referred to a provider to establish care? No .   Dental Screening: Recommended annual dental exams for proper oral hygiene  Diabetic Foot Exam: n/a  Community Resource Referral / Chronic Care Management: CRR required this visit?  No   CCM required this visit?  No     Plan:     I have personally reviewed and noted the following in the patient's chart:   Medical and social history Use of alcohol, tobacco or illicit drugs  Current medications and supplements including opioid prescriptions. Patient is not currently taking opioid prescriptions. Functional ability and status Nutritional status Physical activity Advanced directives List of other physicians Hospitalizations, surgeries, and ER visits in previous 12 months Vitals Screenings to include cognitive, depression, and falls Referrals and appointments  In  addition, I have reviewed and discussed with patient certain preventive protocols, quality metrics, and best practice recommendations. A written personalized care plan for preventive services as well as general preventive health recommendations were provided to patient.     Barb Merino, LPN   [id number]   After Visit Summary: (Pick Up) Due to this being a telephonic visit, with patients personalized plan was offered to patient and patient has requested to Pick up at office.  Nurse Notes: none

## 2023-05-18 LAB — CBC
Hematocrit: 35.8 % (ref 34.0–46.6)
Hemoglobin: 11.8 g/dL (ref 11.1–15.9)
MCH: 32 pg (ref 26.6–33.0)
MCHC: 33 g/dL (ref 31.5–35.7)
MCV: 97 fL (ref 79–97)
Platelets: 213 10*3/uL (ref 150–450)
RBC: 3.69 x10E6/uL — ABNORMAL LOW (ref 3.77–5.28)
RDW: 11.5 % — ABNORMAL LOW (ref 11.7–15.4)
WBC: 6.4 10*3/uL (ref 3.4–10.8)

## 2023-05-18 LAB — CMP14+EGFR
ALT: 9 [IU]/L (ref 0–32)
AST: 21 [IU]/L (ref 0–40)
Albumin: 4 g/dL (ref 3.7–4.7)
Alkaline Phosphatase: 56 [IU]/L (ref 44–121)
BUN/Creatinine Ratio: 23 (ref 12–28)
BUN: 26 mg/dL (ref 8–27)
Bilirubin Total: 0.4 mg/dL (ref 0.0–1.2)
CO2: 24 mmol/L (ref 20–29)
Calcium: 10 mg/dL (ref 8.7–10.3)
Chloride: 104 mmol/L (ref 96–106)
Creatinine, Ser: 1.12 mg/dL — ABNORMAL HIGH (ref 0.57–1.00)
Globulin, Total: 3.4 g/dL (ref 1.5–4.5)
Glucose: 92 mg/dL (ref 70–99)
Potassium: 4.3 mmol/L (ref 3.5–5.2)
Sodium: 139 mmol/L (ref 134–144)
Total Protein: 7.4 g/dL (ref 6.0–8.5)
eGFR: 47 mL/min/{1.73_m2} — ABNORMAL LOW (ref >59)

## 2023-05-18 LAB — PROTEIN ELECTROPHORESIS, SERUM
A/G Ratio: 1 (ref 0.7–1.7)
Albumin ELP: 3.7 g/dL (ref 2.9–4.4)
Alpha 1: 0.2 g/dL (ref 0.0–0.4)
Alpha 2: 0.7 g/dL (ref 0.4–1.0)
Beta: 0.9 g/dL (ref 0.7–1.3)
Gamma Globulin: 1.9 g/dL — ABNORMAL HIGH (ref 0.4–1.8)
Globulin, Total: 3.7 g/dL (ref 2.2–3.9)

## 2023-05-18 LAB — LIPID PANEL
Chol/HDL Ratio: 2.1 {ratio} (ref 0.0–4.4)
Cholesterol, Total: 177 mg/dL (ref 100–199)
HDL: 83 mg/dL (ref >39)
LDL Chol Calc (NIH): 76 mg/dL (ref 0–99)
Triglycerides: 104 mg/dL (ref 0–149)
VLDL Cholesterol Cal: 18 mg/dL (ref 5–40)

## 2023-05-18 LAB — RENAL FUNCTION PANEL: Phosphorus: 2.8 mg/dL — ABNORMAL LOW (ref 3.0–4.3)

## 2023-05-18 LAB — TSH+FREE T4
Free T4: 1.24 ng/dL (ref 0.82–1.77)
TSH: 0.566 u[IU]/mL (ref 0.450–4.500)

## 2023-05-18 LAB — PTH, INTACT AND CALCIUM: PTH: 28 pg/mL (ref 15–65)

## 2023-05-19 DIAGNOSIS — Z23 Encounter for immunization: Secondary | ICD-10-CM | POA: Insufficient documentation

## 2023-05-19 DIAGNOSIS — R011 Cardiac murmur, unspecified: Secondary | ICD-10-CM | POA: Insufficient documentation

## 2023-05-19 DIAGNOSIS — R7989 Other specified abnormal findings of blood chemistry: Secondary | ICD-10-CM | POA: Insufficient documentation

## 2023-05-19 NOTE — Assessment & Plan Note (Signed)
I will check thyroid panel today. I will make further recommendations after reviewing the results.

## 2023-05-19 NOTE — Assessment & Plan Note (Signed)
Chronic, uncontrolled. She states her blood pressure is better controlled at home. For now, she will continue with amlodipine 2.5mg  daily and valsartan/hct 160/12.5mg  daily. Encouraged to follow low sodium diet. She will f/u in 3-4 months. If elevated at that time, will increase amlodipine 5mg  daily.

## 2023-05-19 NOTE — Assessment & Plan Note (Signed)
Chronic, now followed by Nephrology. I will check labs as below and forward to her nephrologist. Chronic, she is encouraged to stay well hydrated, avoid NSAIDs and keep BP controlled to prevent progression of CKD.

## 2023-05-19 NOTE — Assessment & Plan Note (Signed)
She is encouraged to strive for BMI less than 30 to decrease cardiac risk. Advised to aim for at least 150 minutes of exercise per week.  

## 2023-05-21 ENCOUNTER — Ambulatory Visit: Payer: Self-pay

## 2023-05-21 NOTE — Patient Outreach (Signed)
Care Coordination   Follow Up Visit Note   05/21/2023 Name: Christina Terry MRN: [id number] DOB: May 22, 1932  Christina Terry is a 89 y.o. year old female who sees Dorothyann Peng, MD for primary care. I spoke with  Heath Lark by phone today.  What matters to the patients health and wellness today?  Patient would like to continue to monitor her BP at home. Patient would like to receive grief counseling.     Goals Addressed               This Visit's Progress     Patient Stated     To keep blood pressure better controlled (pt-stated)   On track     Care Coordination Interventions: Evaluation of current treatment plan related to hypertension self management and patient's adherence to plan as established by provider Determined patient's daughter in law continues to intermittently check her BP, she also uses [facility]'s automated BP cuff  Discussed patient reports having BP within normal range, 120's/70's with home BP checks, educated patient on target BP <130/70 Discussed patient feels her elevated BP readings during MD visits are related to anxiety  Reviewed and discussed with patient PCP noted she will re-evaluate at next scheduled visit and may adjust her BP dosage and patient verbalizes understanding  Advised patient, providing education and rationale, to monitor blood pressure daily and record, calling PCP for findings outside established parameters Re-iterated the importance of adherence to following a low Sodium diet and patient verbalizes understanding  Last practice recorded BP readings:  BP Readings from Last 3 Encounters:  05/15/23 (!) 140/80  01/29/23 132/74  09/13/22 122/70   Most recent eGFR/CrCl:  Lab Results  Component Value Date   EGFR 47 (L) 05/15/2023    No components found for: "CRCL"       Other     To get help with grief        Care Coordination Interventions: Evaluation of current treatment plan related to grief and patient's adherence to plan as  established by provider Discussed with patient she recently lost her daughter in law who passed away unexpectedly in her home  Active listening / Reflection utilized  Emotional Support Provided Discussed with patient she would like to ask her PCP for an anti-anxiety medication to use as needed to help with effective coping Sent secure chat to Dr. Allyne Gee who will outreach to patient to further discuss Sent LCSW referral to Jenel Lucks requesting she outreach to patient for grief counseling      Interventions Today    Flowsheet Row Most Recent Value  Chronic Disease   Chronic disease during today's visit Hypertension (HTN), Chronic Kidney Disease/End Stage Renal Disease (ESRD)  General Interventions   General Interventions Discussed/Reviewed General Interventions Discussed, General Interventions Reviewed, Labs, Doctor Visits, Communication with  Doctor Visits Discussed/Reviewed Doctor Visits Discussed, Doctor Visits Reviewed, PCP  Communication with Social Work, PCP/Specialists  [Jasmine Lewis LCSW,  Dr. Allyne Gee PCP]  Education Interventions   Education Provided Provided Education  Provided Verbal Education On Mental Health/Coping with Illness, When to see the doctor, Medication  Mental Health Interventions   Mental Health Discussed/Reviewed Grief and Loss, Mental Health Reviewed, Mental Health Discussed, Refer to Social Work for counseling  Refer to Social Work for counseling regarding Grief and Loss  Jenel Lucks LCSW]  Pharmacy Interventions   Pharmacy Dicussed/Reviewed Pharmacy Topics Discussed, Pharmacy Topics Reviewed, Medications and their functions          SDOH assessments and interventions  completed:  No     Care Coordination Interventions:  Yes, provided   Follow up plan: Referral made to Jenel Lucks LCSW for help with grief counseling Follow up call scheduled for 07/16/23 @1 :30 PM    Encounter Outcome:  Patient Visit Completed

## 2023-05-21 NOTE — Patient Instructions (Signed)
Visit Information  Thank you for taking time to visit with me today. Please don't hesitate to contact me if I can be of assistance to you.   Following are the goals we discussed today:   Goals Addressed               This Visit's Progress     Patient Stated     To keep blood pressure better controlled (pt-stated)   On track     Care Coordination Interventions: Evaluation of current treatment plan related to hypertension self management and patient's adherence to plan as established by provider Determined patient's daughter in law continues to intermittently check her BP, she also uses [facility] automated BP cuff  Discussed patient reports having BP within normal range, 120's/70's with home BP checks, educated patient on target BP <130/70 Discussed patient feels her elevated BP readings during MD visits are related to anxiety  Reviewed and discussed with patient PCP noted she will re-evaluate at next scheduled visit and may adjust her BP dosage and patient verbalizes understanding  Advised patient, providing education and rationale, to monitor blood pressure daily and record, calling PCP for findings outside established parameters Re-iterated the importance of adherence to following a low Sodium diet and patient verbalizes understanding  Last practice recorded BP readings:  BP Readings from Last 3 Encounters:  05/15/23 (!) 140/80  01/29/23 132/74  09/13/22 122/70   Most recent eGFR/CrCl:  Lab Results  Component Value Date   EGFR 47 (L) 05/15/2023    No components found for: "CRCL"      Other     To get help with grief        Care Coordination Interventions: Evaluation of current treatment plan related to grief and patient's adherence to plan as established by provider Discussed with patient she recently lost her daughter in law who passed away unexpectedly in her home  Active listening / Reflection utilized  Emotional Support Provided Discussed with patient she would  like to ask her PCP for an anti-anxiety medication to use as needed to help with effective coping Sent secure chat to Dr. Allyne Gee who will outreach to patient to further discuss Sent LCSW referral to Jenel Lucks requesting she outreach to patient for grief counseling         Our next appointment is by telephone on 07/16/23 at 1:30 PM  Please call the care guide team at [phone] if you need to cancel or reschedule your appointment.   If you are experiencing a Mental Health or Behavioral Health Crisis or need someone to talk to, please call 1-800-273-TALK (toll free, 24 hour hotline)  The patient verbalized understanding of instructions, educational materials, and care plan provided today and DECLINED offer to receive copy of patient instructions, educational materials, and care plan.   Delsa Sale RN BSN CCM [place]  [facility], [facility] Health Nurse Care Coordinator  Direct Dial: [phone] Website: [email or web address]

## 2023-05-23 DIAGNOSIS — N281 Cyst of kidney, acquired: Secondary | ICD-10-CM | POA: Diagnosis not present

## 2023-05-23 DIAGNOSIS — E559 Vitamin D deficiency, unspecified: Secondary | ICD-10-CM | POA: Diagnosis not present

## 2023-05-23 DIAGNOSIS — N1832 Chronic kidney disease, stage 3b: Secondary | ICD-10-CM | POA: Diagnosis not present

## 2023-05-23 DIAGNOSIS — I129 Hypertensive chronic kidney disease with stage 1 through stage 4 chronic kidney disease, or unspecified chronic kidney disease: Secondary | ICD-10-CM | POA: Diagnosis not present

## 2023-05-30 ENCOUNTER — Other Ambulatory Visit: Payer: Self-pay | Admitting: Internal Medicine

## 2023-06-12 ENCOUNTER — Ambulatory Visit (HOSPITAL_COMMUNITY): Payer: Medicare Other

## 2023-06-29 ENCOUNTER — Ambulatory Visit (HOSPITAL_COMMUNITY): Payer: Medicare Other

## 2023-07-10 ENCOUNTER — Other Ambulatory Visit: Payer: Self-pay | Admitting: Internal Medicine

## 2023-07-11 ENCOUNTER — Telehealth: Payer: Self-pay | Admitting: *Deleted

## 2023-07-11 NOTE — Progress Notes (Signed)
  Care Coordination Note  07/11/2023 Name: Christina Terry MRN: [id number] DOB: May 05, 1932  Christina Terry is a 89 y.o. year old female who is a primary care patient of Dorothyann Peng, MD and is actively engaged with the care management team. I reached out to Heath Lark by phone today to assist with scheduling an initial visit with the Licensed Clinical Social Worker  Follow up plan: Telephone appointment with care management team member scheduled for:11/29  [facility]  Care Coordination Care Guide  Direct Dial: [phone]

## 2023-07-16 ENCOUNTER — Ambulatory Visit: Payer: Self-pay

## 2023-07-17 NOTE — Patient Instructions (Signed)
Visit Information  Thank you for taking time to visit with me today. Please don't hesitate to contact me if I can be of assistance to you.   Following are the goals we discussed today:   Goals Addressed               This Visit's Progress     Patient Stated     To keep blood pressure better controlled (pt-stated)   On track     Care Coordination Interventions: Evaluation of current treatment plan related to hypertension self management and patient's adherence to plan as established by provider Reviewed and discussed with patient her upcoming scheduled Echocardiogram scheduled for 08/01/23 @3 :50 PM, patient confirms she will keep this appointment as scheduled  Patient will keep her upcoming scheduled Echocardiogram as directed  Patient will report new or worsening symptoms to her doctor promptly Patient will continue to work with nurse care coordinator for chronic disease management and care coordination needs  Last practice recorded BP readings:  BP Readings from Last 3 Encounters:  05/15/23 (!) 140/80  01/29/23 132/74  09/13/22 122/70   Most recent eGFR/CrCl:  Lab Results  Component Value Date   EGFR 47 (L) 05/15/2023    No components found for: "CRCL"         Other     To get help with grief   On track     Care Coordination Interventions: Evaluation of current treatment plan related to grief and patient's adherence to plan as established by provider Active listening / Reflection utilized  Emotional Support Provided Reviewed and discussed with patient her upcoming scheduled telephone visit with Jenel Lucks LCSW is scheduled for 07/20/23 @09 :00 AM Discussed patient has recently lost her sister shortly following her daughter in law  Patient will keep her scheduled telephone appointment with Jenel Lucks LCSW scheduled for 07/20/23 @09 :00 AM for grief counseling         Our next appointment is by telephone on 08/29/23 at 09:30 AM  Please call the care guide team  at [phone] if you need to cancel or reschedule your appointment.   If you are experiencing a Mental Health or Behavioral Health Crisis or need someone to talk to, please call 1-800-273-TALK (toll free, 24 hour hotline)  The patient verbalized understanding of instructions, educational materials, and care plan provided today and DECLINED offer to receive copy of patient instructions, educational materials, and care plan.

## 2023-07-17 NOTE — Patient Outreach (Signed)
  Care Coordination   Follow Up Visit Note   07/17/2023 Name: Christina Terry MRN: [id number] DOB: 1932/08/05  Christina Terry is a 89 y.o. year old female who sees Dorothyann Peng, MD for primary care. I spoke with  Heath Lark by phone today.  What matters to the patients health and wellness today?  Patient would like to speak with the SW for grief counseling.     Goals Addressed               This Visit's Progress     Patient Stated     To keep blood pressure better controlled (pt-stated)   On track     Care Coordination Interventions: Evaluation of current treatment plan related to hypertension self management and patient's adherence to plan as established by provider Reviewed and discussed with patient her upcoming scheduled Echocardiogram scheduled for 08/01/23 @3 :50 PM, patient confirms she will keep this appointment as scheduled  Patient will keep her upcoming scheduled Echocardiogram as directed  Patient will report new or worsening symptoms to her doctor promptly Patient will continue to work with nurse care coordinator for chronic disease management and care coordination needs  Last practice recorded BP readings:  BP Readings from Last 3 Encounters:  05/15/23 (!) 140/80  01/29/23 132/74  09/13/22 122/70   Most recent eGFR/CrCl:  Lab Results  Component Value Date   EGFR 47 (L) 05/15/2023    No components found for: "CRCL"      Other     To get help with grief   On track     Care Coordination Interventions: Evaluation of current treatment plan related to grief and patient's adherence to plan as established by provider Active listening / Reflection utilized  Emotional Support Provided Reviewed and discussed with patient her upcoming scheduled telephone visit with Jenel Lucks LCSW is scheduled for 07/20/23 @09 :00 AM Discussed patient has recently lost her sister shortly following her daughter in law  Patient will keep her scheduled telephone appointment with  Jenel Lucks LCSW scheduled for 07/20/23 @09 :00 AM for grief counseling     Interventions Today    Flowsheet Row Most Recent Value  Chronic Disease   Chronic disease during today's visit Other  [grief]  General Interventions   General Interventions Discussed/Reviewed General Interventions Discussed, General Interventions Reviewed, Vaccines, Doctor Visits  Vaccines RSV  Doctor Visits Discussed/Reviewed Doctor Visits Reviewed, Doctor Visits Discussed, PCP  Education Interventions   Education Provided Provided Education  Provided Verbal Education On Mental Health/Coping with Illness, When to see the doctor  Mental Health Interventions   Mental Health Discussed/Reviewed Mental Health Discussed, Mental Health Reviewed, Grief and Loss          SDOH assessments and interventions completed:  No     Care Coordination Interventions:  Yes, provided   Follow up plan: Follow up call scheduled for 08/29/23 @09 :30 AM    Encounter Outcome:  Patient Visit Completed

## 2023-07-20 ENCOUNTER — Ambulatory Visit: Payer: Self-pay | Admitting: Licensed Clinical Social Worker

## 2023-07-20 NOTE — Patient Outreach (Signed)
  Care Coordination   Initial Visit Note   07/20/2023 Name: Christina Terry MRN: [id number] DOB: 1931-11-08  Christina Terry is a 89 y.o. year old female who sees Dorothyann Peng, MD for primary care. I spoke with  Heath Lark by phone today.  What matters to the patients health and wellness today?  Grief Support    Goals Addressed             This Visit's Progress    COMPLETED: LCSW-Grief Support Resources       Activities and task to complete in order to accomplish goals.   Keep all upcoming appointments discussed today Continue with compliance of taking medication prescribed by Doctor Implement healthy coping skills discussed to assist with management of symptoms F/up with [facility] for grief support, if needed         SDOH assessments and interventions completed:  Yes  SDOH Interventions Today    Flowsheet Row Most Recent Value  SDOH Interventions   Food Insecurity Interventions Intervention Not Indicated  Housing Interventions Intervention Not Indicated  Transportation Interventions Intervention Not Indicated        Care Coordination Interventions:  Yes, provided  Interventions Today    Flowsheet Row Most Recent Value  Chronic Disease   Chronic disease during today's visit Other  [Grief]  General Interventions   General Interventions Discussed/Reviewed General Interventions Discussed, Community Resources, Doctor Visits  Doctor Visits Discussed/Reviewed Doctor Visits Discussed  Mental Health Interventions   Mental Health Discussed/Reviewed Mental Health Discussed, Grief and Loss, Coping Strategies  [Patient has strong support from family. Endorsed grief symptoms noting she is feeling better. Has hx of receiving services from [facility] and agreed to f/up with them, if needed]  Nutrition Interventions   Nutrition Discussed/Reviewed Nutrition Discussed       Follow up plan: No further intervention required.   Encounter Outcome:  Patient Visit Completed    Jenel Lucks, MSW, LCSW [facility] Care Management [facility]  [facility] [email or web address] Phone [phone] 10:28 AM

## 2023-07-20 NOTE — Patient Instructions (Signed)
Visit Information  Thank you for taking time to visit with me today. Please don't hesitate to contact me if I can be of assistance to you.   Following are the goals we discussed today:   Goals Addressed             This Visit's Progress    COMPLETED: LCSW-Grief Support Resources       Activities and task to complete in order to accomplish goals.   Keep all upcoming appointments discussed today Continue with compliance of taking medication prescribed by Doctor Implement healthy coping skills discussed to assist with management of symptoms F/up with [facility] for grief support, if needed         Please call the care guide team at [phone] if you need to cancel or reschedule your appointment.   If you are experiencing a Mental Health or Behavioral Health Crisis or need someone to talk to, please call the Suicide and Crisis Lifeline: 988 call 911   The patient verbalized understanding of instructions, educational materials, and care plan provided today and DECLINED offer to receive copy of patient instructions, educational materials, and care plan.   No further follow up required: Patient reports she does not need any additional support, at this time  Jenel Lucks, MSW, LCSW [facility] Care Management [facility]  [facility] [email or web address] Phone [phone] 10:29 AM

## 2023-08-01 ENCOUNTER — Ambulatory Visit (HOSPITAL_COMMUNITY): Payer: Medicare Other | Attending: Internal Medicine

## 2023-08-01 DIAGNOSIS — R011 Cardiac murmur, unspecified: Secondary | ICD-10-CM | POA: Diagnosis not present

## 2023-08-01 LAB — ECHOCARDIOGRAM COMPLETE: S' Lateral: 2.13 cm

## 2023-08-27 ENCOUNTER — Encounter: Payer: Medicare Other | Admitting: Internal Medicine

## 2023-09-10 ENCOUNTER — Ambulatory Visit: Payer: Self-pay

## 2023-09-10 NOTE — Patient Outreach (Signed)
Care Coordination   Follow Up Visit Note   09/10/2023 Name: Christina Terry MRN: [id number] DOB: [birth year]  Christina Terry is a 89 y.o. year old female who sees Dorothyann Peng, MD for primary care. I spoke with  Heath Lark by phone today.  What matters to the patients health and wellness today?  Patient would like to continue to check her BP at the pharmacy.     Goals Addressed               This Visit's Progress     Patient Stated     To keep blood pressure better controlled (pt-stated)   On track     Care Coordination Interventions: Evaluation of current treatment plan related to hypertension self management and patient's adherence to plan as established by provider Reviewed medications with patient and discussed importance of compliance Advised patient, providing education and rationale, to monitor blood pressure daily and record, calling PCP for findings outside established parameters, discussed patient will check her BP at [facility] pharmacy and record Educated patient regarding target BP <130/80 Provided education on prescribed diet low Sodium  Assessed social determinant of health barriers Discussed patient will need to reschedule her physical with Dr. Allyne Gee, sent in basket message to [facility] CMA requesting she contact patient to reschedule her physical  Discussed plans with patient for ongoing care coordination follow up and provided patient with direct contact information for nurse care coordinator Last practice recorded BP readings:  BP Readings from Last 3 Encounters:  05/15/23 (!) 140/80  01/29/23 132/74  09/13/22 122/70   Most recent eGFR/CrCl:  Lab Results  Component Value Date   EGFR 47 (L) 05/15/2023    No components found for: "CRCL"       Other     COMPLETED: To get help with grief        Care Coordination Interventions: Evaluation of current treatment plan related to grief and patient's adherence to plan as established by provider Active  listening / Reflection utilized  Emotional Support Provided Discussed with patient she feels her grief has improved since last nurse call, she feels she has had time to accept the losses of her family members and is coping more effectively at this time    Interventions Today    Flowsheet Row Most Recent Value  Chronic Disease   Chronic disease during today's visit Other, Hypertension (HTN)  [grief,  below the knee leg pain]  General Interventions   General Interventions Discussed/Reviewed General Interventions Discussed, General Interventions Reviewed, Doctor Visits  Doctor Visits Discussed/Reviewed Doctor Visits Discussed, Doctor Visits Reviewed, PCP  Exercise Interventions   Exercise Discussed/Reviewed Physical Activity, Exercise Reviewed, Exercise Discussed  Physical Activity Discussed/Reviewed Physical Activity Reviewed, Physical Activity Discussed, Types of exercise  Education Interventions   Education Provided Provided Education  Provided Verbal Education On Medication, Exercise, When to see the doctor, Mental Health/Coping with Illness, Nutrition  Mental Health Interventions   Mental Health Discussed/Reviewed Grief and Loss, Mental Health Discussed, Mental Health Reviewed  Nutrition Interventions   Nutrition Discussed/Reviewed Nutrition Discussed, Nutrition Reviewed, Fluid intake  Pharmacy Interventions   Pharmacy Dicussed/Reviewed Pharmacy Topics Reviewed, Pharmacy Topics Discussed, Medications and their functions, Medication Adherence  Safety Interventions   Safety Discussed/Reviewed Home Safety, Fall Risk, Safety Reviewed, Safety Discussed          SDOH assessments and interventions completed:  Yes  SDOH Interventions Today    Flowsheet Row Most Recent Value  SDOH Interventions   Food Insecurity Interventions Intervention Not  Indicated  Housing Interventions Intervention Not Indicated  Transportation Interventions Intervention Not Indicated  Utilities Interventions  Intervention Not Indicated        Care Coordination Interventions:  No, not indicated   Follow up plan: Follow up call scheduled for 11/08/23 @09 :30 AM     Encounter Outcome:  Patient Visit Completed

## 2023-09-10 NOTE — Patient Instructions (Signed)
Visit Information  Thank you for taking time to visit with me today. Please don't hesitate to contact me if I can be of assistance to you.   Following are the goals we discussed today:   Goals Addressed               This Visit's Progress     Patient Stated     To keep blood pressure better controlled (pt-stated)   On track     Care Coordination Interventions: Evaluation of current treatment plan related to hypertension self management and patient's adherence to plan as established by provider Reviewed medications with patient and discussed importance of compliance Advised patient, providing education and rationale, to monitor blood pressure daily and record, calling PCP for findings outside established parameters, discussed patient will check [facility] BP at [facility] and record Educated patient regarding target BP <130/80 Provided education on prescribed diet low Sodium  Assessed social determinant of health barriers Discussed patient will need to reschedule her physical with Dr. Allyne Gee, sent in basket message to [facility] CMA requesting she contact patient to reschedule her physical  Discussed plans with patient for ongoing care coordination follow up and provided patient with direct contact information for nurse care coordinator Last practice recorded BP readings:  BP Readings from Last 3 Encounters:  05/15/23 (!) 140/80  01/29/23 132/74  09/13/22 122/70   Most recent eGFR/CrCl:  Lab Results  Component Value Date   EGFR 47 (L) 05/15/2023    No components found for: "CRCL"       Other     COMPLETED: To get help with grief        Care Coordination Interventions: Evaluation of current treatment plan related to grief and patient's adherence to plan as established by provider Active listening / Reflection utilized  Emotional Support Provided Discussed with patient she feels her grief has improved since last nurse call, she feels she has had time to accept the losses  of her family members and is coping more effectively at this time        Our next appointment is by telephone on 11/08/23 at 09:30 AM  Please call the care guide team at [phone] if you need to cancel or reschedule your appointment.   If you are experiencing a Mental Health or Behavioral Health Crisis or need someone to talk to, please call 1-800-273-TALK (toll free, 24 hour hotline)  The patient verbalized understanding of instructions, educational materials, and care plan provided today and DECLINED offer to receive copy of patient instructions, educational materials, and care plan.   Delsa Sale RN BSN CCM [place]  [facility] Nurse Care Coordinator  Direct Dial: [phone] Website: [email or web address]

## 2023-11-08 ENCOUNTER — Ambulatory Visit: Payer: Self-pay

## 2023-11-08 NOTE — Patient Outreach (Signed)
 Care Coordination   11/08/2023 Name: Christina Terry MRN: [id number] DOB: January 27, 1932   Care Coordination Outreach Attempts:  An unsuccessful outreach was attempted for an appointment today.  Follow Up Plan:  Additional outreach attempts will be made to offer the patient complex care management information and services.   Encounter Outcome:  No Answer   Care Coordination Interventions:  No, not indicated    Delsa Sale RN BSN CCM [place]  Value-Based Care Institute, [facility] Nurse Care Coordinator  Direct Dial: [phone] Website: [email or web address]

## 2023-12-13 ENCOUNTER — Telehealth: Payer: Self-pay | Admitting: *Deleted

## 2023-12-13 NOTE — Progress Notes (Signed)
 Complex Care Management Care Guide Note  12/13/2023 Name: Christina Terry MRN: [id number] DOB: November 05, 1931  Christina Terry is a 89 y.o. year old female who is a primary care patient of Cleave Curling, MD and is actively engaged with the care management team. I reached out to [name] by phone today to assist with re-scheduling  with the RN Case Manager.  Follow up plan: Unsuccessful telephone outreach attempt made. A HIPAA compliant phone message was left for the patient providing contact information and requesting a return call.  [name]  [facility]  Value-Based Care Institute, [facility] Guide  Direct Dial: [phone]  Fax [phone]

## 2023-12-17 NOTE — Progress Notes (Signed)
 Complex Care Management Care Guide Note  12/17/2023 Name: Christina Terry MRN: [id number] DOB: 02-Apr-1932  Christina Terry is a 89 y.o. year old female who is a primary care patient of Cleave Curling, MD and is actively engaged with the care management team. I reached out to [name] by phone today to assist with re-scheduling  with the RN Case Manager.  Follow up plan: Unsuccessful telephone outreach attempt made. A HIPAA compliant phone message was left for the patient providing contact information and requesting a return call. No further outreach attempts will be made at this time. We have been unable to contact the patient to reschedule for complex care management services.   [name]  [facility]  Value-Based Care Institute, [facility] Guide  Direct Dial: [phone]  Fax [phone]

## 2023-12-20 NOTE — Progress Notes (Signed)
 Complex Care Management Care Guide Note  12/20/2023 Name: [name] MRN: [id number] DOB: July 17, 1932  Christina Terry is a 89 y.o. year old female who is a primary care patient of Cleave Curling, MD and is actively engaged with the care management team. I reached out to [name] by phone today to assist with re-scheduling  with the RN Case Manager.  Follow up plan: Unsuccessful telephone outreach attempt made. No further outreach attempts will be made at this time. We have been unable to contact the patient to reschedule for complex care management services.   [name]  [facility]  Value-Based Care Institute, [facility] Guide  Direct Dial: [phone]  Fax [phone]

## 2024-01-17 ENCOUNTER — Other Ambulatory Visit: Payer: Self-pay | Admitting: Internal Medicine

## 2024-02-02 ENCOUNTER — Other Ambulatory Visit: Payer: Self-pay | Admitting: Internal Medicine

## 2024-02-16 ENCOUNTER — Other Ambulatory Visit: Payer: Self-pay | Admitting: Internal Medicine

## 2024-05-02 ENCOUNTER — Ambulatory Visit

## 2024-05-02 DIAGNOSIS — N1832 Chronic kidney disease, stage 3b: Secondary | ICD-10-CM | POA: Diagnosis not present

## 2024-05-02 DIAGNOSIS — Z0000 Encounter for general adult medical examination without abnormal findings: Secondary | ICD-10-CM

## 2024-05-02 DIAGNOSIS — I129 Hypertensive chronic kidney disease with stage 1 through stage 4 chronic kidney disease, or unspecified chronic kidney disease: Secondary | ICD-10-CM

## 2024-05-02 NOTE — Patient Instructions (Signed)

## 2024-05-02 NOTE — Progress Notes (Signed)
 Subjective:   Christina Terry is a 89 y.o. female who presents for Medicare Annual (Subsequent) preventive examination.  Visit Complete: Virtual I connected with  [name] on 05/02/24 by a audio enabled telemedicine application and verified that I am speaking with the correct person using two identifiers.  Patient Location: Home  Provider Location: Home Office  I discussed the limitations of evaluation and management by telemedicine. The patient expressed understanding and agreed to proceed.  Vital Signs: Because this visit was a virtual/telehealth visit, some criteria may be missing or patient reported. Any vitals not documented were not able to be obtained and vitals that have been documented are patient reported.  Patient Medicare AWV questionnaire was completed by the patient on 05/02/24; I have confirmed that all information answered by patient is correct and no changes since this date.  Cardiac Risk Factors include: hypertension     Objective:    Today's Vitals   There is no height or weight on file to calculate BMI.     05/17/2023    3:03 PM 05/03/2022   11:59 AM 04/20/2021   10:24 AM 03/04/2020    2:27 PM 02/20/2019    9:39 AM  Advanced Directives  Does Patient Have a Medical Advance Directive? Yes No Yes No Yes  Type of Estate agent of [place];Living will  Healthcare Power of [place];Living will  Healthcare Power of [place];Living will  Copy of Healthcare Power of Attorney in Chart? No - copy requested  No - copy requested  No - copy requested   Would patient like information on creating a medical advance directive?    No - Patient declined      Data saved with a previous flowsheet row definition    Current Medications (verified) Outpatient Encounter Medications as of 05/02/2024  Medication Sig   amLODipine  (NORVASC ) 2.5 MG tablet TAKE 1 TABLET BY MOUTH EVERYDAY AT BEDTIME   atorvastatin  (LIPITOR) 10 MG tablet TAKE ONE TABLET BY MOUTH DAILY  MONDAY THROUGH SATURDAY. SKIP SUNDAYS.   DULERA  100-5 MCG/ACT AERO TAKE 2 PUFFS BY MOUTH TWICE A DAY   hydrocortisone  (ANUSOL -HC) 25 MG suppository Place 1 suppository (25 mg total) rectally 2 (two) times daily.   loratadine  (CLARITIN ) 10 MG tablet TAKE 1 TABLET BY MOUTH EVERY DAY AS NEEDED FOR ALLERGY   valsartan -hydrochlorothiazide  (DIOVAN -HCT) 160-12.5 MG tablet TAKE 1 TABLET BY MOUTH EVERY DAY   albuterol  (PROVENTIL  HFA) 108 (90 Base) MCG/ACT inhaler Inhale 2 puffs into the lungs every 6 (six) hours as needed for wheezing or shortness of breath ([facility] in[facility]). (Patient not taking: Reported on 05/02/2024)   No facility-administered encounter medications on file as of 05/02/2024.    Allergies (verified) Aspirin and Sulfonamide derivatives   History: Past Medical History:  Diagnosis Date   Acute myocardial infarction, unspecified site, episode of care unspecified    Unspecified asthma(493.90)    Unspecified essential hypertension    Past Surgical History:  Procedure Laterality Date   sinus surgey     TOTAL ABDOMINAL HYSTERECTOMY W/ BILATERAL SALPINGOOPHORECTOMY     Family History  Problem Relation Age of Onset   Emphysema Father    Alzheimer's disease Father    Heart disease Maternal Grandmother    Heart Problems Mother    Mental illness Mother    Alzheimer's disease Mother    Social History   Socioeconomic History   Marital status: Widowed    Spouse name: Not on file   Number of children: Not on file  Years of education: Not on file   Highest education level: Not on file  Occupational History   Occupation: Cook  Tobacco Use   Smoking status: Never   Smokeless tobacco: Never  Vaping Use   Vaping status: Never Used  Substance and Sexual Activity   Alcohol use: No   Drug use: No   Sexual activity: Not Currently  Other Topics Concern   Not on file  Social History Narrative   Not on file   Social Drivers of Health   Financial Resource Strain: Low Risk   (05/02/2024)   Overall Financial Resource Strain (CARDIA)    Difficulty of Paying Living Expenses: Not very hard  Food Insecurity: No Food Insecurity (05/02/2024)   Hunger Vital Sign    Worried About Running Out of Food in the Last Year: Never true    Ran Out of Food in the Last Year: Never true  Transportation Needs: No Transportation Needs (05/02/2024)   PRAPARE - Administrator, Civil Service (Medical): No    Lack of Transportation (Non-Medical): No  Physical Activity: Insufficiently Active (05/02/2024)   Exercise Vital Sign    Days of Exercise per Week: 2 days    Minutes of Exercise per Session: 60 min  Stress: No Stress Concern Present (05/02/2024)   [employer] of Occupational Health - Occupational Stress Questionnaire    Feeling of Stress: Not at all  Social Connections: Moderately Integrated (05/02/2024)   Social Connection and Isolation Panel    Frequency of Communication with Friends and Family: More than three times a week    Frequency of Social Gatherings with Friends and Family: Never    Attends Religious Services: More than 4 times per year    Active Member of [employer] or Organizations: Yes    Attends Banker Meetings: More than 4 times per year    Marital Status: Widowed    Tobacco Counseling Counseling given: Not Answered   Clinical Intake:  Pre-visit preparation completed: Yes  Pain : No/denies pain     Nutritional Risks: None Diabetes: No  How often do you need to have someone help you when you read instructions, pamphlets, or other written materials from your doctor or pharmacy?: 1 - Never What is the last grade level you completed in school?: 12th  Interpreter Needed?: No  Information entered by :: Caralina Nop h   Activities of Daily Living    05/02/2024    9:13 AM 05/17/2023    2:57 PM  In your present state of health, do you have any difficulty performing the following activities:  Hearing? 1 1  Comment  wears hearing  aids  Vision? 0 0  Difficulty concentrating or making decisions? 0 0  Walking or climbing stairs? 0 0  Dressing or bathing? 0 0  Doing errands, shopping? 0 0  Preparing Food and eating ? N N  Using the Toilet? N N  In the past six months, have you accidently leaked urine? N N  Do you have problems with loss of bowel control? N N  Managing your Medications? N N  Managing your Finances? N N  Housekeeping or managing your Housekeeping? N N    Patient Care Team: Jarold Medici, MD as PCP - General (Internal Medicine)  Indicate any recent Medical Services you may have received from other than [facility] providers in the past year (date may be approximate).     Assessment:   This is a routine wellness examination for [place].  Hearing/Vision  screen No results found.   Goals Addressed   None    Depression Screen    05/02/2024    9:17 AM 05/17/2023    3:05 PM 01/29/2023    9:21 AM 05/03/2022   12:00 PM 04/20/2021   10:27 AM 03/04/2020    2:29 PM 05/28/2019   11:37 AM  PHQ 2/9 Scores  PHQ - 2 Score 0 0 0 0 1 0 0  PHQ- 9 Score  0 0   1     Fall Risk    05/02/2024    9:18 AM 05/17/2023    3:04 PM 01/29/2023    9:20 AM 09/13/2022    9:27 AM 05/03/2022   11:59 AM  Fall Risk   Falls in the past year? 0 0 0 0 0  Number falls in past yr: 0 0 0 0 0  Injury with Fall? 0 0 0 0 0  Risk for fall due to : No Fall Risks Medication side effect No Fall Risks No Fall Risks Medication side effect  Follow up Falls evaluation completed Falls prevention discussed;Falls evaluation completed Falls evaluation completed Falls evaluation completed  Falls prevention discussed;Education provided;Falls evaluation completed      Data saved with a previous flowsheet row definition    MEDICARE RISK AT HOME: Medicare Risk at Home Any stairs in or around the home?: No If so, are there any without handrails?: No Home free of loose throw rugs in walkways, pet beds, electrical cords, etc?: Yes Adequate lighting in  your home to reduce risk of falls?: Yes Life alert?: No Use of a cane, walker or w/c?: No Grab bars in the bathroom?: No Shower chair or bench in shower?: No Elevated toilet seat or a handicapped toilet?: Yes   Cognitive Function:        05/02/2024    9:19 AM 05/17/2023    3:06 PM 05/03/2022   12:01 PM 04/20/2021   10:29 AM 03/04/2020    2:32 PM  6CIT Screen  What Year? 0 points 0 points 0 points 0 points 0 points  What month? 0 points 0 points 0 points 0 points 0 points  What time? 0 points 0 points 0 points 0 points 0 points  Count back from 20 0 points 0 points 0 points 0 points 0 points  Months in reverse 2 points 2 points 2 points 2 points 2 points  Repeat phrase 0 points 0 points 6 points 4 points 4 points  Total Score 2 points 2 points 8 points 6 points 6 points    Immunizations Immunization History  Administered Date(s) Administered   Fluad Quad(high Dose 65+) 05/06/2019, 05/03/2022   Fluad Trivalent(High Dose 65+) 05/15/2023   INFLUENZA, HIGH DOSE SEASONAL PF 06/04/2018, 05/30/2021   Influenza Split 08/02/2011   Influenza Whole 05/19/2009, 05/21/2010   Influenza-Unspecified 05/06/2019   Moderna Sars-Covid-2 Vaccination 09/11/2019, 10/17/2019, 08/17/2020, 01/25/2021   Pneumococcal Conjugate-13 06/26/2018   Pneumococcal Polysaccharide-23 05/19/2009, 04/29/2020   Tdap 09/29/2011, 09/13/2022   Zoster Recombinant(Shingrix ) 09/26/2021    TDAP status: Up to date  Flu Vaccine status: Due, Education has been provided regarding the importance of this vaccine. Advised may receive this vaccine at local pharmacy or Health Dept. Aware to provide a copy of the vaccination record if obtained from local pharmacy or Health Dept. Verbalized acceptance and understanding.  Pneumococcal vaccine status: Up to date  Covid-19 vaccine status: Completed vaccines  Qualifies for Shingles Vaccine? No   Zostavax completed No   Shingrix  Completed?: No.  Education has been provided  regarding the importance of this vaccine. Patient has been advised to call insurance company to determine out of pocket expense if they have not yet received this vaccine. Advised may also receive vaccine at local pharmacy or Health Dept. Verbalized acceptance and understanding.  Screening Tests Health Maintenance  Topic Date Due   Zoster Vaccines- Shingrix  (2 of 2) 11/21/2021   Influenza Vaccine  03/21/2024   COVID-19 Vaccine (5 - 2025-26 season) 04/21/2024   Medicare Annual Wellness (AWV)  05/02/2025   DTaP/Tdap/Td (3 - Td or Tdap) 09/13/2032   Pneumococcal Vaccine: 50+ Years  Completed   DEXA SCAN  Completed   HPV VACCINES  Aged Out   Meningococcal B Vaccine  Aged Out    Health Maintenance  Health Maintenance Due  Topic Date Due   Zoster Vaccines- Shingrix  (2 of 2) 11/21/2021   Influenza Vaccine  03/21/2024   COVID-19 Vaccine (5 - 2025-26 season) 04/21/2024    Lung Cancer Screening: (Low Dose CT Chest recommended if Age 92-80 years, 20 pack-year currently smoking OR have quit w/in 15years.) does not qualify.   Lung Cancer Screening Referral: n/a  Additional Screening:   Dental Screening: Recommended annual dental exams for proper oral hygiene  Diabetic Foot Exam: Diabetic Foot Exam: Completed 05/14/24  Community Resource Referral / Chronic Care Management: CRR required this visit?  No   CCM required this visit?  No     Plan:     I have personally reviewed and noted the following in the patient's chart:   Medical and social history Use of alcohol, tobacco or illicit drugs  Current medications and supplements including opioid prescriptions. Patient is not currently taking opioid prescriptions. Functional ability and status Nutritional status Physical activity Advanced directives List of other physicians Hospitalizations, surgeries, and ER visits in previous 12 months Vitals Screenings to include cognitive, depression, and falls Referrals and  appointments  In addition, I have reviewed and discussed with patient certain preventive protocols, quality metrics, and best practice recommendations. A written personalized care plan for preventive services as well as general preventive health recommendations were provided to patient.     Richerd ONEIDA Lemmings, CMA   05/02/2024   After Visit Summary: (MyChart) Due to this being a telephonic visit, the after visit summary with patients personalized plan was offered to patient via MyChart   Nurse Notes: Richerd Lemmings, CMA

## 2024-05-20 ENCOUNTER — Ambulatory Visit (INDEPENDENT_AMBULATORY_CARE_PROVIDER_SITE_OTHER): Admitting: Internal Medicine

## 2024-05-20 ENCOUNTER — Encounter: Payer: Self-pay | Admitting: Internal Medicine

## 2024-05-20 ENCOUNTER — Ambulatory Visit: Payer: Medicare Other | Admitting: Internal Medicine

## 2024-05-20 VITALS — BP 140/70 | HR 72 | Temp 98.1°F | Ht 60.0 in | Wt 177.6 lb

## 2024-05-20 DIAGNOSIS — I129 Hypertensive chronic kidney disease with stage 1 through stage 4 chronic kidney disease, or unspecified chronic kidney disease: Secondary | ICD-10-CM | POA: Diagnosis not present

## 2024-05-20 DIAGNOSIS — E6609 Other obesity due to excess calories: Secondary | ICD-10-CM

## 2024-05-20 DIAGNOSIS — E7800 Pure hypercholesterolemia, unspecified: Secondary | ICD-10-CM | POA: Insufficient documentation

## 2024-05-20 DIAGNOSIS — Z833 Family history of diabetes mellitus: Secondary | ICD-10-CM | POA: Diagnosis not present

## 2024-05-20 DIAGNOSIS — R7989 Other specified abnormal findings of blood chemistry: Secondary | ICD-10-CM | POA: Diagnosis not present

## 2024-05-20 DIAGNOSIS — Z23 Encounter for immunization: Secondary | ICD-10-CM

## 2024-05-20 DIAGNOSIS — Z6834 Body mass index (BMI) 34.0-34.9, adult: Secondary | ICD-10-CM

## 2024-05-20 DIAGNOSIS — E2839 Other primary ovarian failure: Secondary | ICD-10-CM

## 2024-05-20 DIAGNOSIS — N1832 Chronic kidney disease, stage 3b: Secondary | ICD-10-CM | POA: Diagnosis not present

## 2024-05-20 DIAGNOSIS — I359 Nonrheumatic aortic valve disorder, unspecified: Secondary | ICD-10-CM | POA: Diagnosis not present

## 2024-05-20 DIAGNOSIS — E66811 Obesity, class 1: Secondary | ICD-10-CM

## 2024-05-20 LAB — POCT URINALYSIS DIPSTICK
Bilirubin, UA: NEGATIVE
Blood, UA: NEGATIVE
Glucose, UA: NEGATIVE
Ketones, UA: NEGATIVE
Nitrite, UA: NEGATIVE
Protein, UA: NEGATIVE
Spec Grav, UA: <=1.005 — AB (ref 1.010–1.025)
Urobilinogen, UA: 0.2 U/dL
pH, UA: 6 (ref 5.0–8.0)

## 2024-05-20 NOTE — Assessment & Plan Note (Signed)
 Chronic, currently on atorvastatin . Encouraged to follow heart healthy lifestyle.

## 2024-05-20 NOTE — Assessment & Plan Note (Signed)
 Blood pressure better controlled at home. On amlodipine , valsartan , hydrochlorothiazide . Aware of need to monitor kidney function. Nephrology follow-up next month unless function worsens. - Continue amlodipine , valsartan , hydrochlorothiazide . - Encourage regular water intake. - Perform blood work today to monitor kidney function.

## 2024-05-20 NOTE — Assessment & Plan Note (Signed)
 Chronic, she is encouraged to stay well hydrated, avoid NSAIDs and keep BP controlled to prevent progression of CKD.

## 2024-05-20 NOTE — Progress Notes (Signed)
 I,Victoria T Emmitt, CMA,acting as a neurosurgeon for Catheryn LOISE Slocumb, MD.,have documented all relevant documentation on the behalf of Catheryn LOISE Slocumb, MD,as directed by  Catheryn LOISE Slocumb, MD while in the presence of Catheryn LOISE Slocumb, MD.  Subjective:  Patient ID: Christina Terry , female    DOB: 1932-03-05 , 89 y.o.   MRN: [id number]  Chief Complaint  Patient presents with   Hypertension    Patient presents today for a bp check. She reports compliance with medications. Denies headache, chest pain, and SOB.    HPI Discussed the use of AI scribe software for clinical note transcription with the patient, who gave verbal consent to proceed.  History of Present Illness Christina Terry is a 89 year old female with hypertension who presents for a blood pressure check.  Her blood pressure readings at home are typically around 135/38 mmHg, which she notes is better than the readings taken at the clinic. She experiences anxiety about her health, which causes her blood pressure to rise during clinic visits. She is currently taking amlodipine  2.5 mg at night, valsartan /hydrochlorothiazide  160/12.5 mg in the morning, and uses an inhaler twice a day.  She is also on atorvastatin  10 mg for cholesterol management and takes Claritin  as needed for allergies. She tries to avoid going outside to manage her allergies.  She has a history of a heart murmur and reports having had an ultrasound of the heart last year. She reports sleeping well and not needing to get up to use the bathroom at night.  She drinks water regularly, estimating about eight glasses a day, and maintains a good appetite. She is still working and remains active, walking regularly.   Hypertension This is a chronic problem. The current episode started more than 1 year ago. The problem has been gradually improving since onset. The problem is controlled. Pertinent negatives include no blurred vision. Risk factors for coronary artery disease include  sedentary lifestyle. Past treatments include angiotensin blockers, diuretics and calcium  channel blockers. The current treatment provides moderate improvement. Compliance problems include exercise.  Hypertensive end-organ damage includes kidney disease.     Past Medical History:  Diagnosis Date   Acute myocardial infarction, unspecified site, episode of care unspecified    Unspecified asthma(493.90)    Unspecified essential hypertension      Family History  Problem Relation Age of Onset   Emphysema Father    Alzheimer's disease Father    Heart disease Maternal Grandmother    Heart Problems Mother    Mental illness Mother    Alzheimer's disease Mother      Current Outpatient Medications:    amLODipine  (NORVASC ) 2.5 MG tablet, TAKE 1 TABLET BY MOUTH EVERYDAY AT BEDTIME, Disp: 90 tablet, Rfl: 1   atorvastatin  (LIPITOR) 10 MG tablet, TAKE ONE TABLET BY MOUTH DAILY MONDAY THROUGH SATURDAY. SKIP SUNDAYS., Disp: 72 tablet, Rfl: 1   DULERA  100-5 MCG/ACT AERO, TAKE 2 PUFFS BY MOUTH TWICE A DAY, Disp: 13 each, Rfl: 6   hydrocortisone  (ANUSOL -HC) 25 MG suppository, Place 1 suppository (25 mg total) rectally 2 (two) times daily., Disp: 12 suppository, Rfl: 0   loratadine  (CLARITIN ) 10 MG tablet, TAKE 1 TABLET BY MOUTH EVERY DAY AS NEEDED FOR ALLERGY, Disp: 90 tablet, Rfl: 1   valsartan -hydrochlorothiazide  (DIOVAN -HCT) 160-12.5 MG tablet, TAKE 1 TABLET BY MOUTH EVERY DAY, Disp: 90 tablet, Rfl: 2   albuterol  (PROVENTIL  HFA) 108 (90 Base) MCG/ACT inhaler, Inhale 2 puffs into the lungs every 6 (six) hours as needed for  wheezing or shortness of breath ([facility] inahaler). (Patient not taking: Reported on 05/20/2024), Disp: 1 each, Rfl: prn   Allergies  Allergen Reactions   Aspirin     REACTION: wheeze   Sulfonamide Derivatives      Review of Systems  Constitutional: Negative.   HENT: Negative.    Eyes:  Negative for blurred vision.  Respiratory: Negative.    Cardiovascular: Negative.    Gastrointestinal: Negative.   Neurological: Negative.   Psychiatric/Behavioral: Negative.       Today's Vitals   05/20/24 0948 05/20/24 1012  BP: (!) 160/80 (!) 140/70  Pulse: 72   Temp: 98.1 F (36.7 C)   SpO2: 98%   Weight: 177 lb 9.6 oz (80.6 kg)   Height: 5' (1.524 m)    Body mass index is 34.69 kg/m.  Wt Readings from Last 3 Encounters:  05/20/24 177 lb 9.6 oz (80.6 kg)  05/15/23 172 lb (78 kg)  01/29/23 170 lb 9.6 oz (77.4 kg)     Objective:  Physical Exam Vitals and nursing note reviewed.  Constitutional:      Appearance: Normal appearance. She is obese.  HENT:     Head: Normocephalic and atraumatic.  Eyes:     Extraocular Movements: Extraocular movements intact.  Cardiovascular:     Rate and Rhythm: Normal rate and regular rhythm.     Heart sounds: Murmur heard.  Pulmonary:     Effort: Pulmonary effort is normal.     Breath sounds: Normal breath sounds.  Musculoskeletal:     Cervical back: Normal range of motion.  Skin:    General: Skin is warm.  Neurological:     General: No focal deficit present.     Mental Status: She is alert.  Psychiatric:        Mood and Affect: Mood normal.        Behavior: Behavior normal.         Assessment And Plan:  Hypertensive nephropathy Assessment & Plan: Blood pressure better controlled at home. On amlodipine , valsartan , hydrochlorothiazide . Aware of need to monitor kidney function. Nephrology follow-up next month unless function worsens. - Continue amlodipine , valsartan , hydrochlorothiazide . - Encourage regular water intake. - Perform blood work today to monitor kidney function.  Orders: -     CBC -     CMP14+EGFR -     Lipid panel -     TSH -     POCT urinalysis dipstick  Aortic valve calcification Assessment & Plan: Heart murmur with moderate aortic valve calcification, normal heart function, no significant symptoms. - Schedule echocardiogram in Fall/Winter 2026.   Stage 3b chronic kidney disease  (HCC) Assessment & Plan: Chronic, she is encouraged to stay well hydrated, avoid NSAIDs and keep BP controlled to prevent progression of CKD.    Orders: -     PTH, intact and calcium  -     Phosphorus -     Protein electrophoresis, serum -     POCT urinalysis dipstick  Pure hypercholesterolemia Assessment & Plan: Chronic, currently on atorvastatin . Encouraged to follow heart healthy lifestyle.    Estrogen deficiency -     DG Bone Density; Future  Class 1 obesity due to excess calories with serious comorbidity and body mass index (BMI) of 34.0 to 34.9 in adult Assessment & Plan: She is encouraged to strive for BMI less than 30 to decrease cardiac risk. Advised to aim for at least 150 minutes of exercise per week.    Immunization due -  Flu vaccine HIGH DOSE PF(Fluzone Trivalent)  Family history of diabetes mellitus -     Hemoglobin A1c    Return in 4 months (on 09/19/2024), or physical exam.  Patient was given opportunity to ask questions. Patient verbalized understanding of the plan and was able to repeat key elements of the plan. All questions were answered to their satisfaction.   I, Catheryn LOISE Slocumb, MD, have reviewed all documentation for this visit. The documentation on 05/20/24 for the exam, diagnosis, procedures, and orders are all accurate and complete.   IF YOU HAVE BEEN REFERRED TO A SPECIALIST, IT MAY TAKE 1-2 WEEKS TO SCHEDULE/PROCESS THE REFERRAL. IF YOU HAVE NOT HEARD FROM US /SPECIALIST IN TWO WEEKS, PLEASE GIVE US  A CALL AT [phone].   THE PATIENT IS ENCOURAGED TO PRACTICE SOCIAL DISTANCING DUE TO THE COVID-19 PANDEMIC.

## 2024-05-20 NOTE — Patient Instructions (Signed)
 Hypertension, Adult Hypertension is another name for high blood pressure. High blood pressure forces your heart to work harder to pump blood. This can cause problems over time. There are two numbers in a blood pressure reading. There is a top number (systolic) over a bottom number (diastolic). It is best to have a blood pressure that is below 120/80. What are the causes? The cause of this condition is not known. Some other conditions can lead to high blood pressure. What increases the risk? Some lifestyle factors can make you more likely to develop high blood pressure: Smoking. Not getting enough exercise or physical activity. Being overweight. Having too much fat, sugar, calories, or salt (sodium) in your diet. Drinking too much alcohol. Other risk factors include: Having any of these conditions: Heart disease. Diabetes. High cholesterol. Kidney disease. Obstructive sleep apnea. Having a family history of high blood pressure and high cholesterol. Age. The risk increases with age. Stress. What are the signs or symptoms? High blood pressure may not cause symptoms. Very high blood pressure (hypertensive crisis) may cause: Headache. Fast or uneven heartbeats (palpitations). Shortness of breath. Nosebleed. Vomiting or feeling like you may vomit (nauseous). Changes in how you see. Very bad chest pain. Feeling dizzy. Seizures. How is this treated? This condition is treated by making healthy lifestyle changes, such as: Eating healthy foods. Exercising more. Drinking less alcohol. Your doctor may prescribe medicine if lifestyle changes do not help enough and if: Your top number is above 130. Your bottom number is above 80. Your personal target blood pressure may vary. Follow these instructions at home: Eating and drinking  If told, follow the DASH eating plan. To follow this plan: Fill one half of your plate at each meal with fruits and vegetables. Fill one fourth of your plate  at each meal with whole grains. Whole grains include whole-wheat pasta, brown rice, and whole-grain bread. Eat or drink low-fat dairy products, such as skim milk or low-fat yogurt. Fill one fourth of your plate at each meal with low-fat (lean) proteins. Low-fat proteins include fish, chicken without skin, eggs, beans, and tofu. Avoid fatty meat, cured and processed meat, or chicken with skin. Avoid pre-made or processed food. Limit the amount of salt in your diet to less than 1,500 mg each day. Do not drink alcohol if: Your doctor tells you not to drink. You are pregnant, may be pregnant, or are planning to become pregnant. If you drink alcohol: Limit how much you have to: 0-1 drink a day for women. 0-2 drinks a day for men. Know how much alcohol is in your drink. In the [place] one drink equals one 12 oz bottle of beer (355 mL), one 5 oz glass of wine (148 mL), or one 1 oz glass of hard liquor (44 mL). Lifestyle  Work with your doctor to stay at a healthy weight or to lose weight. Ask your doctor what the best weight is for you. Get at least 30 minutes of exercise that causes your heart to beat faster (aerobic exercise) most days of the week. This may include walking, swimming, or biking. Get at least 30 minutes of exercise that strengthens your muscles (resistance exercise) at least 3 days a week. This may include lifting weights or doing Pilates. Do not smoke or use any products that contain nicotine or tobacco. If you need help quitting, ask your doctor. Check your blood pressure at home as told by your doctor. Keep all follow-up visits. Medicines Take over-the-counter and prescription medicines  only as told by your doctor. Follow directions carefully. Do not skip doses of blood pressure medicine. The medicine does not work as well if you skip doses. Skipping doses also puts you at risk for problems. Ask your doctor about side effects or reactions to medicines that you should watch  for. Contact a doctor if: You think you are having a reaction to the medicine you are taking. You have headaches that keep coming back. You feel dizzy. You have swelling in your ankles. You have trouble with your vision. Get help right away if: You get a very bad headache. You start to feel mixed up (confused). You feel weak or numb. You feel faint. You have very bad pain in your: Chest. Belly (abdomen). You vomit more than once. You have trouble breathing. These symptoms may be an emergency. Get help right away. Call 911. Do not wait to see if the symptoms will go away. Do not drive yourself to the hospital. Summary Hypertension is another name for high blood pressure. High blood pressure forces your heart to work harder to pump blood. For most people, a normal blood pressure is less than 120/80. Making healthy choices can help lower blood pressure. If your blood pressure does not get lower with healthy choices, you may need to take medicine. This information is not intended to replace advice given to you by your health care provider. Make sure you discuss any questions you have with your health care provider. Document Revised: 05/26/2021 Document Reviewed: 05/26/2021 Elsevier Patient Education  2024 [employer].

## 2024-05-20 NOTE — Assessment & Plan Note (Signed)
 Heart murmur with moderate aortic valve calcification, normal heart function, no significant symptoms. - Schedule echocardiogram in Fall/Winter 2026.

## 2024-05-20 NOTE — Assessment & Plan Note (Signed)
 She is encouraged to strive for BMI less than 30 to decrease cardiac risk. Advised to aim for at least 150 minutes of exercise per week.

## 2024-05-21 LAB — CBC
Hematocrit: 35.4 % (ref 34.0–46.6)
Hemoglobin: 11.4 g/dL (ref 11.1–15.9)
MCH: 31.6 pg (ref 26.6–33.0)
MCHC: 32.2 g/dL (ref 31.5–35.7)
MCV: 98 fL — ABNORMAL HIGH (ref 79–97)
Platelets: 217 x10E3/uL (ref 150–450)
RBC: 3.61 x10E6/uL — ABNORMAL LOW (ref 3.77–5.28)
RDW: 11.5 % — ABNORMAL LOW (ref 11.7–15.4)
WBC: 5.6 x10E3/uL (ref 3.4–10.8)

## 2024-05-21 LAB — LIPID PANEL
Chol/HDL Ratio: 2.4 ratio (ref 0.0–4.4)
Cholesterol, Total: 179 mg/dL (ref 100–199)
HDL: 75 mg/dL (ref >39)
LDL Chol Calc (NIH): 88 mg/dL (ref 0–99)
Triglycerides: 91 mg/dL (ref 0–149)
VLDL Cholesterol Cal: 16 mg/dL (ref 5–40)

## 2024-05-21 LAB — PROTEIN ELECTROPHORESIS, SERUM
A/G Ratio: 0.9 (ref 0.7–1.7)
Albumin ELP: 3.4 g/dL (ref 2.9–4.4)
Alpha 1: 0.2 g/dL (ref 0.0–0.4)
Alpha 2: 0.7 g/dL (ref 0.4–1.0)
Beta: 1 g/dL (ref 0.7–1.3)
Gamma Globulin: 1.9 g/dL — ABNORMAL HIGH (ref 0.4–1.8)
Globulin, Total: 3.9 g/dL (ref 2.2–3.9)

## 2024-05-21 LAB — CMP14+EGFR
ALT: 8 IU/L (ref 0–32)
AST: 19 IU/L (ref 0–40)
Albumin: 4.2 g/dL (ref 3.6–4.6)
Alkaline Phosphatase: 57 IU/L (ref 48–129)
BUN/Creatinine Ratio: 24 (ref 12–28)
BUN: 29 mg/dL (ref 10–36)
Bilirubin Total: 0.5 mg/dL (ref 0.0–1.2)
CO2: 22 mmol/L (ref 20–29)
Calcium: 9.7 mg/dL (ref 8.7–10.3)
Chloride: 105 mmol/L (ref 96–106)
Creatinine, Ser: 1.21 mg/dL — ABNORMAL HIGH (ref 0.57–1.00)
Globulin, Total: 3.1 g/dL (ref 1.5–4.5)
Glucose: 91 mg/dL (ref 70–99)
Potassium: 4.3 mmol/L (ref 3.5–5.2)
Sodium: 140 mmol/L (ref 134–144)
Total Protein: 7.3 g/dL (ref 6.0–8.5)
eGFR: 43 mL/min/1.73 — ABNORMAL LOW (ref >59)

## 2024-05-21 LAB — TSH: TSH: 0.469 u[IU]/mL (ref 0.450–4.500)

## 2024-05-21 LAB — HEMOGLOBIN A1C
Est. average glucose Bld gHb Est-mCnc: 94 mg/dL
Hgb A1c MFr Bld: 4.9 % (ref 4.8–5.6)

## 2024-05-21 LAB — PTH, INTACT AND CALCIUM: PTH: 39 pg/mL (ref 15–65)

## 2024-05-21 LAB — PHOSPHORUS: Phosphorus: 3 mg/dL (ref 3.0–4.3)

## 2024-05-24 ENCOUNTER — Ambulatory Visit: Payer: Self-pay | Admitting: Internal Medicine

## 2024-05-27 LAB — SPECIMEN STATUS REPORT

## 2024-05-27 LAB — T4, FREE: Free T4: 1.32 ng/dL (ref 0.82–1.77)

## 2024-07-23 ENCOUNTER — Other Ambulatory Visit: Payer: Self-pay | Admitting: Internal Medicine

## 2024-07-29 ENCOUNTER — Other Ambulatory Visit: Payer: Self-pay | Admitting: Internal Medicine

## 2024-07-29 MED ORDER — VALSARTAN-HYDROCHLOROTHIAZIDE 160-12.5 MG PO TABS: 1.0000 | ORAL_TABLET | Freq: Every day | ORAL | 2 refills | Status: AC

## 2024-07-29 MED ORDER — LORATADINE 10 MG PO TABS: ORAL_TABLET | ORAL | 1 refills | Status: AC

## 2024-07-29 MED ORDER — AMLODIPINE BESYLATE 2.5 MG PO TABS: ORAL_TABLET | ORAL | 1 refills | Status: AC

## 2024-07-29 NOTE — Telephone Encounter (Signed)
 Copied from CRM #[id number]. Topic: Clinical - Medication Refill >> Jul 29, 2024 11:33 AM Joesph B wrote: Medication: valsartan -hydrochlorothiazide  (DIOVAN -HCT) 160-12.5 MG tablet  amLODipine  (NORVASC ) 2.5 MG tablet   loratadine  (CLARITIN ) 10 MG tablet   Has the patient contacted their pharmacy? Yes (Agent: If no, request that the patient contact the pharmacy for the refill. If patient does not wish to contact the pharmacy document the reason why and proceed with request.) (Agent: If yes, when and what did the pharmacy advise?)  This is the patient's preferred pharmacy:  [facility] - [place], [place] - [address] [place] [address] Phone: [phone] Fax: [phone]  Is this the correct pharmacy for this prescription? Yes If no, delete pharmacy and type the correct one.   Has the prescription been filled recently? Yes  Is the patient out of the medication? N/a pharmacy calling  Has the patient been seen for an appointment in the last year OR does the patient have an upcoming appointment? Yes  Can we respond through MyChart? No  Agent: Please be advised that Rx refills may take up to 3 business days. We ask that you follow-up with your pharmacy.

## 2024-09-30 ENCOUNTER — Encounter: Payer: Self-pay | Admitting: Internal Medicine
